# Patient Record
Sex: Female | Born: 1995 | Race: Black or African American | Hispanic: No | Marital: Single | Smoking: Never smoker
Health system: Southern US, Community
[De-identification: ages and names within clinical notes are randomized; demographics above are authoritative.]

## PROBLEM LIST (undated history)

## (undated) DIAGNOSIS — F419 Anxiety disorder, unspecified: Secondary | ICD-10-CM

## (undated) DIAGNOSIS — N760 Acute vaginitis: Secondary | ICD-10-CM

## (undated) DIAGNOSIS — B9689 Other specified bacterial agents as the cause of diseases classified elsewhere: Secondary | ICD-10-CM

## (undated) HISTORY — PX: NO PAST SURGERIES: SHX2092

## (undated) HISTORY — PX: WISDOM TOOTH EXTRACTION: SHX21

---
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
## 2015-12-06 ENCOUNTER — Emergency Department (HOSPITAL_COMMUNITY)
Admission: EM | Admit: 2015-12-06 | Discharge: 2015-12-06 | Disposition: A | Discharge status: Home / Self Care | Payer: Medicaid - Out of State | Attending: Emergency Medicine | Admitting: Emergency Medicine

## 2015-12-06 ENCOUNTER — Encounter (HOSPITAL_COMMUNITY): Payer: Self-pay | Admitting: Emergency Medicine

## 2015-12-06 DIAGNOSIS — R109 Unspecified abdominal pain: Secondary | ICD-10-CM | POA: Diagnosis present

## 2015-12-06 DIAGNOSIS — Z5321 Procedure and treatment not carried out due to patient leaving prior to being seen by health care provider: Secondary | ICD-10-CM | POA: Insufficient documentation

## 2015-12-06 LAB — CBC
HEMATOCRIT: 38.2 % (ref 36.0–46.0)
HEMOGLOBIN: 12.9 g/dL (ref 12.0–15.0)
MCH: 31.4 pg (ref 26.0–34.0)
MCHC: 33.8 g/dL (ref 30.0–36.0)
MCV: 92.9 fL (ref 78.0–100.0)
Platelets: 184 10*3/uL (ref 150–400)
RBC: 4.11 MIL/uL (ref 3.87–5.11)
RDW: 13.9 % (ref 11.5–15.5)
WBC: 6.4 10*3/uL (ref 4.0–10.5)

## 2015-12-06 LAB — URINALYSIS, ROUTINE W REFLEX MICROSCOPIC
Bilirubin Urine: NEGATIVE
GLUCOSE, UA: NEGATIVE mg/dL
Hgb urine dipstick: NEGATIVE
KETONES UR: NEGATIVE mg/dL
LEUKOCYTES UA: NEGATIVE
Nitrite: NEGATIVE
PH: 6 (ref 5.0–8.0)
Protein, ur: NEGATIVE mg/dL
SPECIFIC GRAVITY, URINE: 1.031 — AB (ref 1.005–1.030)

## 2015-12-06 LAB — COMPREHENSIVE METABOLIC PANEL
ALBUMIN: 3.8 g/dL (ref 3.5–5.0)
ALT: 18 U/L (ref 14–54)
ANION GAP: 5 (ref 5–15)
AST: 20 U/L (ref 15–41)
Alkaline Phosphatase: 51 U/L (ref 38–126)
BUN: 10 mg/dL (ref 6–20)
CHLORIDE: 109 mmol/L (ref 101–111)
CO2: 27 mmol/L (ref 22–32)
Calcium: 9.3 mg/dL (ref 8.9–10.3)
Creatinine, Ser: 0.77 mg/dL (ref 0.44–1.00)
GFR calc Af Amer: >60 mL/min (ref >60)
GFR calc non Af Amer: >60 mL/min (ref >60)
GLUCOSE: 91 mg/dL (ref 65–99)
POTASSIUM: 4.1 mmol/L (ref 3.5–5.1)
SODIUM: 141 mmol/L (ref 135–145)
Total Bilirubin: 0.4 mg/dL (ref 0.3–1.2)
Total Protein: 6.6 g/dL (ref 6.5–8.1)

## 2015-12-06 LAB — I-STAT BETA HCG BLOOD, ED (MC, WL, AP ONLY): I-stat hCG, quantitative: <5 m[IU]/mL (ref <5)

## 2015-12-06 LAB — LIPASE, BLOOD: LIPASE: 26 U/L (ref 11–51)

## 2015-12-06 NOTE — ED Notes (Signed)
Pt found in triage and upset that she wasn't found when her name was called the first time. Pt made aware this rn would move her to the next room. Pt states she just wants to leave and wants to just get a note work and not see a doctor. Pt informed she will need to see a doctor for a note. Pt aware she has a room ready and this RN will take her if she is willingly. Charge RN Italychad speaking with pt about seeing a MD if she would like a work note and pt refuses and states she has to go to work and no time to stay in ED any longer.

## 2015-12-06 NOTE — ED Notes (Signed)
Called Pt for room. No response. 

## 2015-12-06 NOTE — ED Triage Notes (Signed)
Pt states since last night she has had lower abd pain and a headache. denies any n/v/d.

## 2015-12-06 NOTE — ED Notes (Signed)
Pt called multiple times throughout several locations in the lobby. No answer.

## 2016-01-22 ENCOUNTER — Encounter (HOSPITAL_BASED_OUTPATIENT_CLINIC_OR_DEPARTMENT_OTHER): Payer: Self-pay | Admitting: Emergency Medicine

## 2016-01-22 ENCOUNTER — Emergency Department (HOSPITAL_BASED_OUTPATIENT_CLINIC_OR_DEPARTMENT_OTHER)
Admission: EM | Admit: 2016-01-22 | Discharge: 2016-01-22 | Disposition: A | Discharge status: Home / Self Care | Payer: No Typology Code available for payment source | Attending: Emergency Medicine | Admitting: Emergency Medicine

## 2016-01-22 ENCOUNTER — Emergency Department (HOSPITAL_BASED_OUTPATIENT_CLINIC_OR_DEPARTMENT_OTHER): Payer: No Typology Code available for payment source

## 2016-01-22 DIAGNOSIS — Y929 Unspecified place or not applicable: Secondary | ICD-10-CM | POA: Insufficient documentation

## 2016-01-22 DIAGNOSIS — M79671 Pain in right foot: Secondary | ICD-10-CM | POA: Insufficient documentation

## 2016-01-22 DIAGNOSIS — Y939 Activity, unspecified: Secondary | ICD-10-CM | POA: Insufficient documentation

## 2016-01-22 DIAGNOSIS — M79642 Pain in left hand: Secondary | ICD-10-CM | POA: Insufficient documentation

## 2016-01-22 DIAGNOSIS — Y999 Unspecified external cause status: Secondary | ICD-10-CM | POA: Insufficient documentation

## 2016-01-22 DIAGNOSIS — S0031XA Abrasion of nose, initial encounter: Secondary | ICD-10-CM | POA: Insufficient documentation

## 2016-01-22 DIAGNOSIS — M542 Cervicalgia: Secondary | ICD-10-CM | POA: Insufficient documentation

## 2016-01-22 DIAGNOSIS — T07XXXA Unspecified multiple injuries, initial encounter: Secondary | ICD-10-CM

## 2016-01-22 MED ORDER — IBUPROFEN 800 MG PO TABS: 800.0000 mg | ORAL_TABLET | Freq: Three times a day (TID) | ORAL | 0 refills | Status: DC

## 2016-01-22 NOTE — ED Notes (Signed)
ED Provider at bedside. 

## 2016-01-22 NOTE — ED Notes (Signed)
ED Provider at bedside to discuss results. 

## 2016-01-22 NOTE — ED Notes (Signed)
Given ice pack
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
## 2016-09-03 ENCOUNTER — Encounter (HOSPITAL_COMMUNITY): Payer: Self-pay | Admitting: *Deleted

## 2016-09-03 ENCOUNTER — Emergency Department (HOSPITAL_COMMUNITY)
Admission: EM | Admit: 2016-09-03 | Discharge: 2016-09-03 | Disposition: A | Discharge status: Home / Self Care | Payer: No Typology Code available for payment source | Attending: Emergency Medicine | Admitting: Emergency Medicine

## 2016-09-03 DIAGNOSIS — J029 Acute pharyngitis, unspecified: Secondary | ICD-10-CM | POA: Insufficient documentation

## 2016-09-03 DIAGNOSIS — B9689 Other specified bacterial agents as the cause of diseases classified elsewhere: Secondary | ICD-10-CM

## 2016-09-03 DIAGNOSIS — N760 Acute vaginitis: Secondary | ICD-10-CM | POA: Insufficient documentation

## 2016-09-03 HISTORY — DX: Other specified bacterial agents as the cause of diseases classified elsewhere: B96.89

## 2016-09-03 HISTORY — DX: Other specified bacterial agents as the cause of diseases classified elsewhere: N76.0

## 2016-09-03 LAB — URINALYSIS, ROUTINE W REFLEX MICROSCOPIC
Bilirubin Urine: NEGATIVE
GLUCOSE, UA: NEGATIVE mg/dL
Hgb urine dipstick: NEGATIVE
KETONES UR: NEGATIVE mg/dL
LEUKOCYTES UA: NEGATIVE
Nitrite: NEGATIVE
PH: 7 (ref 5.0–8.0)
PROTEIN: NEGATIVE mg/dL
Specific Gravity, Urine: 1.017 (ref 1.005–1.030)

## 2016-09-03 LAB — COMPREHENSIVE METABOLIC PANEL
ALBUMIN: 4 g/dL (ref 3.5–5.0)
ALK PHOS: 56 U/L (ref 38–126)
ALT: 16 U/L (ref 14–54)
ANION GAP: 4 — AB (ref 5–15)
AST: 22 U/L (ref 15–41)
BILIRUBIN TOTAL: 0.4 mg/dL (ref 0.3–1.2)
BUN: 7 mg/dL (ref 6–20)
CALCIUM: 9.5 mg/dL (ref 8.9–10.3)
CO2: 29 mmol/L (ref 22–32)
Chloride: 104 mmol/L (ref 101–111)
Creatinine, Ser: 0.89 mg/dL (ref 0.44–1.00)
GFR calc Af Amer: >60 mL/min (ref >60)
Glucose, Bld: 86 mg/dL (ref 65–99)
POTASSIUM: 3.8 mmol/L (ref 3.5–5.1)
Sodium: 137 mmol/L (ref 135–145)
TOTAL PROTEIN: 6.8 g/dL (ref 6.5–8.1)

## 2016-09-03 LAB — WET PREP, GENITAL
SPERM: NONE SEEN
TRICH WET PREP: NONE SEEN
Yeast Wet Prep HPF POC: NONE SEEN

## 2016-09-03 LAB — CBC
HEMATOCRIT: 39.4 % (ref 36.0–46.0)
HEMOGLOBIN: 13.5 g/dL (ref 12.0–15.0)
MCH: 32.1 pg (ref 26.0–34.0)
MCHC: 34.3 g/dL (ref 30.0–36.0)
MCV: 93.6 fL (ref 78.0–100.0)
Platelets: 182 10*3/uL (ref 150–400)
RBC: 4.21 MIL/uL (ref 3.87–5.11)
RDW: 13.3 % (ref 11.5–15.5)
WBC: 8.1 10*3/uL (ref 4.0–10.5)

## 2016-09-03 LAB — POC URINE PREG, ED: Preg Test, Ur: NEGATIVE

## 2016-09-03 LAB — RAPID STREP SCREEN (MED CTR MEBANE ONLY): STREPTOCOCCUS, GROUP A SCREEN (DIRECT): NEGATIVE

## 2016-09-03 MED ORDER — AZITHROMYCIN 250 MG PO TABS
1000.0000 mg | ORAL_TABLET | Freq: Once | ORAL | Status: AC
  Administered 2016-09-03: 1000 mg via ORAL
  Filled 2016-09-03: qty 4

## 2016-09-03 MED ORDER — METRONIDAZOLE 500 MG PO TABS: 500.0000 mg | ORAL_TABLET | Freq: Two times a day (BID) | ORAL | 0 refills | Status: DC

## 2016-09-03 MED ORDER — CEFTRIAXONE SODIUM 250 MG IJ SOLR
250.0000 mg | Freq: Once | INTRAMUSCULAR | Status: AC
  Administered 2016-09-03: 250 mg via INTRAMUSCULAR
  Filled 2016-09-03: qty 250

## 2016-09-03 NOTE — ED Provider Notes (Signed)
[facility]PT Provider Note   CSN: [id number] Arrival date & time: 09/03/16  1435     History   Chief Complaint Chief Complaint  Patient presents with  . Vaginal Discharge    HPI Nancy May is a 21 y.o. female.  HPI 21 year old female G0 LMP 2 weeks ago normal birth control pills today complaining of vaginal discharge and achy lower pelvic discort consistent with prior episodes of bacterial vaginosis. She denies any nausea, vomiting, fever, chills, abnormal vaginal bleeding, or urinary tract infection symptoms. She states she also has a sore throat began 2 days ago with nasal congestion and coughing. She not had fever with this. She took NyQuil for her symptoms yesterday. She is eating and speaking with difficulty. Past Medical History:  Diagnosis Date  . Bacterial vaginosis     There are no active problems to display for this patient.   History reviewed. No pertinent surgical history.  OB History    No data available       Home Medications    Prior to Admission medications   Medication Sig Start Date End Date Taking? Authorizing Provider  ibuprofen (ADVIL,MOTRIN) 800 MG tablet Take 1 tablet (800 mg total) by mouth 3 (three) times daily. 01/22/16   Fayrene Helper, PA-C    Family History No family history on file.  Social History Social History  Substance Use Topics  . Smoking status: Never Smoker  . Smokeless tobacco: Never Used  . Alcohol use No     Allergies   Patient has no known allergies.   Review of Systems Review of Systems  All other systems reviewed and are negative.    Physical Exam Updated Vital Signs BP 107/70 (BP Location: Right Arm)   Pulse 92   Temp 98.7 F (37.1 C) (Oral)   Resp 16   Ht 1.702 m (5\' 7" )   Wt 64.9 kg (143 lb)   SpO2 100%   BMI 22.40 kg/m   Physical Exam  Constitutional: She appears well-developed and well-nourished.  HENT:  Head: Normocephalic and atraumatic.  Right Ear: External ear normal.    Left Ear: External ear normal.  Nose: Nose normal.  Mouth/Throat: Oropharynx is clear and moist.  Eyes: Pupils are equal, round, and reactive to light. Conjunctivae and EOM are normal.  Neck: Normal range of motion. Neck supple.  Cardiovascular: Normal rate, regular rhythm and normal heart sounds.   Pulmonary/Chest: Effort normal and breath sounds normal.  Abdominal: Soft. Bowel sounds are normal.  Genitourinary: Vaginal discharge found.  Genitourinary Comments: Some whitish discharge. No cervical motion tenderness. No tenderness on bimanual exam of her uterus mild tenderness over left ovary no tenderness over right ovary  Musculoskeletal: Normal range of motion.  Neurological: She is alert.  Skin: Skin is warm and dry. Capillary refill takes less than 2 seconds.  Psychiatric: She has a normal mood and affect.  Nursing note and vitals reviewed.    ED Treatments / Results  Labs (all labs ordered are listed, but only abnormal results are displayed) Labs Reviewed  COMPREHENSIVE METABOLIC PANEL - Abnormal; Notable for the following:       Result Value   Anion gap 4 (*)    All other components within normal limits  RAPID STREP SCREEN (NOT AT [facility])  CULTURE, GROUP A STREP ([facility])  CBC  URINALYSIS, ROUTINE W REFLEX MICROSCOPIC  POC URINE PREG, ED    EKG  EKG Interpretation None       Radiology No results found.  Procedures Procedures (including critical care time)  Medications Ordered in ED Medications - No data to display   Initial Impression / Assessment and Plan / ED Course  I have reviewed the triage vital signs and the nursing notes.  Pertinent labs & imaging results that were available during my care of the patient were reviewed by me and considered in my medical decision making (see chart for details).    1- vaginal d/c- will treat for bv, rocephin and zithromax given in ed for cervicitis and possible pelvic infxns- gc and chlamydia ordered 2- sore throat-  patient with low probability for strep with centor score of 0.      Final Clinical Impressions(s) / ED Diagnoses   Final diagnoses:  BV (bacterial vaginosis)  Viral pharyngitis    New Prescriptions New Prescriptions   No medications on file     Margarita Grizzle, MD 09/03/16 2258

## 2016-09-03 NOTE — ED Triage Notes (Signed)
Pt is here for evaluation of BV.  She reports hx of same and some abdominal pain and vaginal discharge x over one week.  SHe would also like to be evaluated for URI and sore throat.

## 2016-09-04 LAB — GC/CHLAMYDIA PROBE AMP (~~LOC~~) NOT AT ARMC
CHLAMYDIA, DNA PROBE: NEGATIVE
NEISSERIA GONORRHEA: NEGATIVE

## 2016-09-06 LAB — CULTURE, GROUP A STREP (THRC)

## 2017-10-04 ENCOUNTER — Emergency Department (HOSPITAL_COMMUNITY)
Admission: EM | Admit: 2017-10-04 | Discharge: 2017-10-04 | Disposition: A | Discharge status: Home / Self Care | Payer: Self-pay | Attending: Emergency Medicine | Admitting: Emergency Medicine

## 2017-10-04 ENCOUNTER — Encounter (HOSPITAL_COMMUNITY): Payer: Self-pay | Admitting: Emergency Medicine

## 2017-10-04 ENCOUNTER — Emergency Department (HOSPITAL_COMMUNITY): Payer: Self-pay

## 2017-10-04 DIAGNOSIS — M545 Low back pain: Secondary | ICD-10-CM | POA: Insufficient documentation

## 2017-10-04 DIAGNOSIS — M7918 Myalgia, other site: Secondary | ICD-10-CM

## 2017-10-04 DIAGNOSIS — M25571 Pain in right ankle and joints of right foot: Secondary | ICD-10-CM | POA: Insufficient documentation

## 2017-10-04 MED ORDER — AMOXICILLIN-POT CLAVULANATE 875-125 MG PO TABS: 1.0000 | ORAL_TABLET | Freq: Two times a day (BID) | ORAL | 0 refills | Status: DC

## 2017-10-04 MED ORDER — IBUPROFEN 600 MG PO TABS: 600.0000 mg | ORAL_TABLET | Freq: Four times a day (QID) | ORAL | 0 refills | Status: DC | PRN

## 2017-10-04 MED ORDER — ACETAMINOPHEN 325 MG PO TABS
650.0000 mg | ORAL_TABLET | Freq: Once | ORAL | Status: AC
  Administered 2017-10-04: 650 mg via ORAL
  Filled 2017-10-04: qty 2

## 2017-10-04 NOTE — Discharge Instructions (Addendum)
Your xray did not shows signs of fracture or dislocation.  Please wear the ASO brace for support.  Please use crutches as needed to allow for rest.  Please follow rice therapy attached in handout.  You can take Tylenol or ibuprofen as needed for pain.  Do not take ibuprofen if you think you may be pregnant.  As we discussed there is no appreciable area that would require incision and drainage today.  This does not mean in the future you will not develop an area that requires incision and drainage.  I have attached a handout on pilonidal cyst.  I am prescribing an antibiotic at this time. Please take all of your antibiotics until finished!   You may develop abdominal discomfort or diarrhea from the antibiotic.  You may help offset this with probiotics which you can buy or get in yogurt. Do not eat or take the probiotics until 2 hours after your antibiotic. Do not take your medicine if develop an itchy rash, swelling in your mouth or lips, or difficulty breathing.   These follow-up with your primary care provider in 1 week if your symptoms do not improve. If you develop worsening or new concerning symptoms you can return to the emergency department for re-evaluation.

## 2017-10-04 NOTE — ED Notes (Signed)
Ortho tech called for ASO ankle and crutches. Discharge will be done after application of these items.
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
## 2017-10-04 NOTE — ED Notes (Signed)
Patient given discharge teaching and verbalized understanding. Patient ambulated out of ED on crutches.

## 2017-10-04 NOTE — ED Triage Notes (Signed)
Pt c/o cyst in buttock area for "while" her boyfriend told her that had little drainage out of it this morning.  Also having right ankle pain "all my life, but keeps popping and feels out of place" and right lateral knee pains "when bending".

## 2017-12-17 ENCOUNTER — Encounter (HOSPITAL_COMMUNITY): Payer: Self-pay | Admitting: *Deleted

## 2017-12-17 ENCOUNTER — Emergency Department (HOSPITAL_COMMUNITY)
Admission: EM | Admit: 2017-12-17 | Discharge: 2017-12-18 | Discharge status: Against Medical Advice | Payer: Self-pay | Attending: Emergency Medicine | Admitting: Emergency Medicine

## 2017-12-17 DIAGNOSIS — Z5321 Procedure and treatment not carried out due to patient leaving prior to being seen by health care provider: Secondary | ICD-10-CM | POA: Insufficient documentation

## 2017-12-17 NOTE — ED Triage Notes (Signed)
Pt reports ongoing bilateral knee pain but has been worse in left knee since yesterday.

## 2017-12-18 NOTE — ED Notes (Signed)
Did not respond to name in lobby.

## 2017-12-18 NOTE — ED Notes (Signed)
No answer x2 
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
## 2018-09-06 ENCOUNTER — Other Ambulatory Visit: Payer: Self-pay

## 2018-09-06 ENCOUNTER — Encounter (HOSPITAL_COMMUNITY): Payer: Self-pay

## 2018-09-06 ENCOUNTER — Emergency Department (HOSPITAL_COMMUNITY)
Admission: EM | Admit: 2018-09-06 | Discharge: 2018-09-06 | Disposition: A | Discharge status: Home / Self Care | Payer: Self-pay | Attending: Emergency Medicine | Admitting: Emergency Medicine

## 2018-09-06 DIAGNOSIS — Z5321 Procedure and treatment not carried out due to patient leaving prior to being seen by health care provider: Secondary | ICD-10-CM | POA: Insufficient documentation

## 2018-09-06 LAB — COMPREHENSIVE METABOLIC PANEL
ALT: 22 U/L (ref 0–44)
AST: 23 U/L (ref 15–41)
Albumin: 3.9 g/dL (ref 3.5–5.0)
Alkaline Phosphatase: 59 U/L (ref 38–126)
Anion gap: 7 (ref 5–15)
BUN: 11 mg/dL (ref 6–20)
CO2: 27 mmol/L (ref 22–32)
Calcium: 9.4 mg/dL (ref 8.9–10.3)
Chloride: 106 mmol/L (ref 98–111)
Creatinine, Ser: 0.83 mg/dL (ref 0.44–1.00)
GFR calc Af Amer: >60 mL/min (ref >60)
GFR calc non Af Amer: >60 mL/min (ref >60)
Glucose, Bld: 105 mg/dL — ABNORMAL HIGH (ref 70–99)
Potassium: 3.8 mmol/L (ref 3.5–5.1)
Sodium: 140 mmol/L (ref 135–145)
Total Bilirubin: 0.4 mg/dL (ref 0.3–1.2)
Total Protein: 7.4 g/dL (ref 6.5–8.1)

## 2018-09-06 LAB — CBC
HCT: 39.8 % (ref 36.0–46.0)
Hemoglobin: 13.3 g/dL (ref 12.0–15.0)
MCH: 31.9 pg (ref 26.0–34.0)
MCHC: 33.4 g/dL (ref 30.0–36.0)
MCV: 95.4 fL (ref 80.0–100.0)
Platelets: 185 10*3/uL (ref 150–400)
RBC: 4.17 MIL/uL (ref 3.87–5.11)
RDW: 12.8 % (ref 11.5–15.5)
WBC: 8.8 10*3/uL (ref 4.0–10.5)
nRBC: 0 % (ref 0.0–0.2)

## 2018-09-06 LAB — I-STAT BETA HCG BLOOD, ED (MC, WL, AP ONLY): I-stat hCG, quantitative: <5 m[IU]/mL (ref <5)

## 2018-09-06 LAB — LIPASE, BLOOD: Lipase: 26 U/L (ref 11–51)

## 2018-09-06 MED ORDER — SODIUM CHLORIDE 0.9% FLUSH: 3.0000 mL | Freq: Once | INTRAVENOUS | Status: DC

## 2018-09-06 NOTE — ED Triage Notes (Signed)
Patient c/o generalized abdominal pain and yesterday yesterday. Patient states the abdominal pain is worse today and diarrhea less today.

## 2018-11-02 ENCOUNTER — Emergency Department (HOSPITAL_COMMUNITY)
Admission: EM | Admit: 2018-11-02 | Discharge: 2018-11-02 | Disposition: A | Discharge status: Home / Self Care | Payer: Self-pay | Attending: Emergency Medicine | Admitting: Emergency Medicine

## 2018-11-02 ENCOUNTER — Encounter (HOSPITAL_COMMUNITY): Payer: Self-pay | Admitting: Emergency Medicine

## 2018-11-02 ENCOUNTER — Other Ambulatory Visit: Payer: Self-pay

## 2018-11-02 DIAGNOSIS — Z79899 Other long term (current) drug therapy: Secondary | ICD-10-CM | POA: Insufficient documentation

## 2018-11-02 DIAGNOSIS — L0501 Pilonidal cyst with abscess: Secondary | ICD-10-CM | POA: Insufficient documentation

## 2018-11-02 MED ORDER — IBUPROFEN 400 MG PO TABS
600.0000 mg | ORAL_TABLET | Freq: Once | ORAL | Status: AC
  Administered 2018-11-02: 600 mg via ORAL
  Filled 2018-11-02: qty 1

## 2018-11-02 MED ORDER — HYDROCODONE-ACETAMINOPHEN 5-325 MG PO TABS: 1.0000 | ORAL_TABLET | Freq: Four times a day (QID) | ORAL | 0 refills | Status: DC | PRN

## 2018-11-02 MED ORDER — LIDOCAINE-EPINEPHRINE (PF) 2 %-1:200000 IJ SOLN
10.0000 mL | Freq: Once | INTRAMUSCULAR | Status: AC
  Administered 2018-11-02: 10 mL
  Filled 2018-11-02: qty 20

## 2018-11-02 NOTE — ED Provider Notes (Signed)
[facility] EMERGENCY DEPARTMENT Provider Note   CSN: [id number] Arrival date & time: 11/02/18  1901     History   Chief Complaint Chief Complaint  Patient presents with  . Cyst    HPI Nancy May is a 23 y.o. female.     Patient to the ED with complaint of progressive, painful swelling to the left upper buttock over the last 2-3 days. No fever, nausea, drainage from the area. She reports a similar episode months ago that resolved spontaneously. She reports being afraid to have a bowel movement secondary to pain and feels constipated.   The history is provided by the patient. No language interpreter was used.    Past Medical History:  Diagnosis Date  . Bacterial vaginosis     There are no active problems to display for this patient.   No past surgical history on file.   OB History   No obstetric history on file.      Home Medications    Prior to Admission medications   Medication Sig Start Date End Date Taking? Authorizing Provider  amoxicillin-clavulanate (AUGMENTIN) 875-125 MG tablet Take 1 tablet by mouth every 12 (twelve) hours. 10/04/17   Maczis, Barth Kirks, PA-C  ibuprofen (ADVIL,MOTRIN) 600 MG tablet Take 1 tablet (600 mg total) by mouth every 6 (six) hours as needed. 10/04/17   Maczis, Barth Kirks, PA-C    Family History Family History  Problem Relation Age of Onset  . Heart failure Mother     Social History Social History   Tobacco Use  . Smoking status: Never Smoker  . Smokeless tobacco: Never Used  Substance Use Topics  . Alcohol use: No  . Drug use: No     Allergies   Patient has no known allergies.   Review of Systems Review of Systems  Constitutional: Negative for chills and fever.  HENT: Negative.   Gastrointestinal: Positive for constipation. Negative for abdominal pain and nausea.  Musculoskeletal:       See HPI.  Skin: Positive for wound.  Neurological: Negative.      Physical Exam Updated Vital  Signs BP (!) 138/101   Pulse (!) 103   Temp 98.4 F (36.9 C) (Oral)   Resp 20   Ht 5\' 7"  (1.702 m)   Wt 70.3 kg   SpO2 98%   BMI 24.27 kg/m   Physical Exam Constitutional:      Appearance: She is well-developed.  Neck:     Musculoskeletal: Normal range of motion.  Pulmonary:     Effort: Pulmonary effort is normal.  Musculoskeletal: Normal range of motion.  Skin:    General: Skin is warm and dry.     Comments: There is an indurated area measuring approximately 3 cm x 3 cm along the left pilonidal buttock. No perirectal induration or tenderness.   Neurological:     Mental Status: She is alert and oriented to person, place, and time.      ED Treatments / Results  Labs (all labs ordered are listed, but only abnormal results are displayed) Labs Reviewed - No data to display  EKG None  Radiology No results found.  Procedures .Marland KitchenIncision and Drainage  Date/Time: 11/02/2018 10:54 PM Performed by: Charlann Lange, PA-C Authorized by: Charlann Lange, PA-C   Consent:    Consent obtained:  Verbal   Consent given by:  Patient Location:    Type:  Pilonidal cyst   Size:  3 x 3 cm   Location:  Anogenital  Anogenital location:  Pilonidal Pre-procedure details:    Skin preparation:  Betadine Anesthesia (see MAR for exact dosages):    Anesthesia method:  Local infiltration   Local anesthetic:  Lidocaine 2% WITH epi Procedure type:    Complexity:  Simple Procedure details:    Needle aspiration: no     Incision types:  Single straight   Scalpel blade:  11   Wound management:  Probed and deloculated and irrigated with saline   Drainage:  Bloody and purulent   Drainage amount:  Moderate   Packing materials:  1/4 in iodoform gauze Post-procedure details:    Patient tolerance of procedure:  Tolerated well, no immediate complications   (including critical care time)  Medications Ordered in ED Medications  lidocaine-EPINEPHrine (XYLOCAINE W/EPI) 2 %-1:200000 (PF)  injection 10 mL (has no administration in time range)  ibuprofen (ADVIL) tablet 600 mg (600 mg Oral Given 11/02/18 1919)     Initial Impression / Assessment and Plan / ED Course  I have reviewed the triage vital signs and the nursing notes.  Pertinent labs & imaging results that were available during my care of the patient were reviewed by me and considered in my medical decision making (see chart for details).        Patient to ED with left pilonidal abscess requiring I&D as per above procedure note.   After care instructions discussed, including 2 day recheck for packing removal.   Final Clinical Impressions(s) / ED Diagnoses   Final diagnoses:  None   1. Pilonidal abscess  ED Discharge Orders    None       Elpidio Anis, Cordelia Poche 11/02/18 2257    Tegeler, Canary Brim, MD 11/02/18 [id number]

## 2018-11-02 NOTE — Discharge Instructions (Addendum)
Return in 2 days for packing removal and recheck. Take ibuprofen 600 mg (3 Advil or Motril, or generic ibuprofen) every 6 hours for pain and Norco if needed for severe pain.   Return sooner with any fever, severe pain or new concern.

## 2018-11-02 NOTE — ED Triage Notes (Signed)
Patient says she has a "cyst" close to her rectum. She report being in a lot of pain. She states she had this about 7 months ago but not this painful.

## 2018-11-02 NOTE — ED Notes (Signed)
Patient verbalizes understanding of discharge instructions. Opportunity for questioning and answers were provided. Armband removed by staff, pt discharged from ED.  

## 2018-11-05 ENCOUNTER — Other Ambulatory Visit: Payer: Self-pay

## 2018-11-05 ENCOUNTER — Emergency Department (HOSPITAL_COMMUNITY)
Admission: EM | Admit: 2018-11-05 | Discharge: 2018-11-05 | Disposition: A | Discharge status: Home / Self Care | Payer: Self-pay | Attending: Emergency Medicine | Admitting: Emergency Medicine

## 2018-11-05 DIAGNOSIS — Z5189 Encounter for other specified aftercare: Secondary | ICD-10-CM

## 2018-11-05 DIAGNOSIS — L0501 Pilonidal cyst with abscess: Secondary | ICD-10-CM | POA: Insufficient documentation

## 2018-11-05 DIAGNOSIS — Z79899 Other long term (current) drug therapy: Secondary | ICD-10-CM | POA: Insufficient documentation

## 2018-11-05 DIAGNOSIS — Z4800 Encounter for change or removal of nonsurgical wound dressing: Secondary | ICD-10-CM | POA: Insufficient documentation

## 2018-11-05 NOTE — ED Provider Notes (Signed)
[facility] EMERGENCY DEPARTMENT Provider Note   CSN: [id number] Arrival date & time: 11/05/18  1536     History   Chief Complaint No chief complaint on file.   HPI Nancy May is a 23 y.o. female.     HPI   23 year old female presents today for packing removal.  Patient was seen on 11/02/2018 with pilonidal abscess.  She had I&D and packing placed.  She notes some ongoing pain at the area no worsening symptoms no fever.  Past Medical History:  Diagnosis Date  . Bacterial vaginosis     There are no active problems to display for this patient.   No past surgical history on file.   OB History   No obstetric history on file.      Home Medications    Prior to Admission medications   Medication Sig Start Date End Date Taking? Authorizing Provider  amoxicillin-clavulanate (AUGMENTIN) 875-125 MG tablet Take 1 tablet by mouth every 12 (twelve) hours. 10/04/17   Maczis, Barth Kirks, PA-C  HYDROcodone-acetaminophen (NORCO/VICODIN) 5-325 MG tablet Take 1 tablet by mouth every 6 (six) hours as needed for severe pain. 11/02/18   Charlann Lange, PA-C  ibuprofen (ADVIL,MOTRIN) 600 MG tablet Take 1 tablet (600 mg total) by mouth every 6 (six) hours as needed. 10/04/17   Maczis, Barth Kirks, PA-C    Family History Family History  Problem Relation Age of Onset  . Heart failure Mother     Social History Social History   Tobacco Use  . Smoking status: Never Smoker  . Smokeless tobacco: Never Used  Substance Use Topics  . Alcohol use: No  . Drug use: No     Allergies   Patient has no known allergies.   Review of Systems Review of Systems  All other systems reviewed and are negative.    Physical Exam Updated Vital Signs BP 114/74 (BP Location: Left Arm)   Pulse 66   Temp 98.5 F (36.9 C) (Oral)   Resp 16   SpO2 100%   Physical Exam Vitals signs and nursing note reviewed.  Constitutional:      Appearance: She is well-developed.   HENT:     Head: Normocephalic and atraumatic.  Eyes:     General: No scleral icterus.       Right eye: No discharge.        Left eye: No discharge.     Conjunctiva/sclera: Conjunctivae normal.     Pupils: Pupils are equal, round, and reactive to light.  Neck:     Musculoskeletal: Normal range of motion.     Vascular: No JVD.     Trachea: No tracheal deviation.  Pulmonary:     Effort: Pulmonary effort is normal.     Breath sounds: No stridor.  Skin:    Comments: Packing in place to pilonidal region no surrounding erythema or significant induration  Neurological:     Mental Status: She is alert and oriented to person, place, and time.     Coordination: Coordination normal.  Psychiatric:        Behavior: Behavior normal.        Thought Content: Thought content normal.        Judgment: Judgment normal.      ED Treatments / Results  Labs (all labs ordered are listed, but only abnormal results are displayed) Labs Reviewed - No data to display  EKG None  Radiology No results found.  Procedures Procedures (including critical care time)  Medications  Ordered in ED Medications - No data to display   Initial Impression / Assessment and Plan / ED Course  I have reviewed the triage vital signs and the nursing notes.  Pertinent labs & imaging results that were available during my care of the patient were reviewed by me and considered in my medical decision making (see chart for details).        23 year old female here for wound check.  Packing was removed wound healing appropriately no signs of active infection discharged with symptomatic care return precautions.  Verbalized understanding and agreement to today's plan.  Final Clinical Impressions(s) / ED Diagnoses   Final diagnoses:  Wound check, abscess    ED Discharge Orders    None       Rosalio Loud 11/05/18 1553    Raeford Razor, MD 11/05/18 1649

## 2018-11-05 NOTE — ED Triage Notes (Signed)
Pt here to have packing removed from a cyst she had drained on 10/17. Reports some pain and drainage from the site.

## 2018-11-05 NOTE — Discharge Instructions (Addendum)
Please read attached information. If you experience any new or worsening signs or symptoms please return to the emergency room for evaluation. Please follow-up with your primary care provider or specialist as discussed.  °

## 2018-11-16 ENCOUNTER — Encounter (HOSPITAL_COMMUNITY): Payer: Self-pay | Admitting: Emergency Medicine

## 2018-11-16 ENCOUNTER — Emergency Department (HOSPITAL_COMMUNITY)
Admission: EM | Admit: 2018-11-16 | Discharge: 2018-11-16 | Disposition: A | Discharge status: Home / Self Care | Payer: Self-pay | Attending: Emergency Medicine | Admitting: Emergency Medicine

## 2018-11-16 ENCOUNTER — Other Ambulatory Visit: Payer: Self-pay

## 2018-11-16 DIAGNOSIS — L0501 Pilonidal cyst with abscess: Secondary | ICD-10-CM | POA: Insufficient documentation

## 2018-11-16 MED ORDER — DOXYCYCLINE HYCLATE 100 MG PO CAPS: 100.0000 mg | ORAL_CAPSULE | Freq: Two times a day (BID) | ORAL | 0 refills | Status: DC

## 2018-11-16 MED ORDER — IBUPROFEN 400 MG PO TABS
600.0000 mg | ORAL_TABLET | Freq: Once | ORAL | Status: AC
  Administered 2018-11-16: 600 mg via ORAL
  Filled 2018-11-16: qty 1

## 2018-11-16 MED ORDER — DOXYCYCLINE HYCLATE 100 MG PO TABS
100.0000 mg | ORAL_TABLET | Freq: Once | ORAL | Status: AC
  Administered 2018-11-16: 100 mg via ORAL
  Filled 2018-11-16: qty 1

## 2018-11-16 MED ORDER — IBUPROFEN 600 MG PO TABS: 600.0000 mg | ORAL_TABLET | Freq: Four times a day (QID) | ORAL | 0 refills | Status: DC | PRN

## 2018-11-16 NOTE — Discharge Instructions (Signed)
Please take antibiotics as directed, use ibuprofen 600 mg every 6 hours for pain, Tylenol as needed for additional pain.  There is not an area large enough for drainage at this time but antibiotics and warm soaks and compresses may help bring this to ahead.  If you have increasing pain and swelling in this area please return to the ED for reevaluation, or if you develop any fevers or other new or concerning symptoms.  For recurrent pilonidal cyst you can follow-up with general surgery.  You can establish primary care with the [facility].

## 2018-11-16 NOTE — ED Provider Notes (Signed)
[facility] EMERGENCY DEPARTMENT Provider Note   CSN: [id number] Arrival date & time: 11/16/18  0906     History   Chief Complaint Chief Complaint  Patient presents with  . Abscess    HPI Nancy May is a 23 y.o. female.     Nancy May is a 23 y.o. female with a history of recurrent pilonidal abscesses and BV, who presents to the ED for pain over the gluteal cleft.  Pain started yesterday.  Patient reports she was seen in the ED on 10/17 for similar pain and had an incision and drainage of a pilonidal abscess, she has had these multiple times in the past but this was the first 1 that required drainage.  She reports that this is improved and seemed to resolve but 1 day ago she started to feel pain and swelling in this area again and is concerned it is returning.  She reports pain is severe and is worse when she is sitting, making it difficult for her to drive to get to work.  She has not looked at the area, and has not noted any drainage.  No fevers or chills.  No vomiting or diarrhea.  After last drainage she was not placed on antibiotics.  She does not have a PCP here in the area to follow-up with, and is working on getting established with Medicaid.  No other aggravating or alleviating factors.     Past Medical History:  Diagnosis Date  . Bacterial vaginosis     There are no active problems to display for this patient.   History reviewed. No pertinent surgical history.   OB History   No obstetric history on file.      Home Medications    Prior to Admission medications   Medication Sig Start Date End Date Taking? Authorizing Provider  amoxicillin-clavulanate (AUGMENTIN) 875-125 MG tablet Take 1 tablet by mouth every 12 (twelve) hours. 10/04/17   Maczis, Elmer Sow, PA-C  doxycycline (VIBRAMYCIN) 100 MG capsule Take 1 capsule (100 mg total) by mouth 2 (two) times daily. One po bid x 7 days 11/16/18   Dartha Lodge, PA-C   HYDROcodone-acetaminophen (NORCO/VICODIN) 5-325 MG tablet Take 1 tablet by mouth every 6 (six) hours as needed for severe pain. 11/02/18   Elpidio Anis, PA-C  ibuprofen (ADVIL) 600 MG tablet Take 1 tablet (600 mg total) by mouth every 6 (six) hours as needed. 11/16/18   Dartha Lodge, PA-C    Family History Family History  Problem Relation Age of Onset  . Heart failure Mother     Social History Social History   Tobacco Use  . Smoking status: Never Smoker  . Smokeless tobacco: Never Used  Substance Use Topics  . Alcohol use: No  . Drug use: No     Allergies   Patient has no known allergies.   Review of Systems Review of Systems  Constitutional: Negative for chills and fever.  Gastrointestinal: Negative for abdominal pain, nausea, rectal pain and vomiting.  Skin: Positive for color change. Negative for wound.       Abscess     Physical Exam Updated Vital Signs BP 117/72 (BP Location: Right Arm)   Pulse 69   Temp 98.5 F (36.9 C) (Oral)   Resp 16   LMP 11/15/2018   SpO2 98%   Physical Exam Vitals signs and nursing note reviewed.  Constitutional:      General: She is not in acute distress.    Appearance:  Normal appearance. She is well-developed and normal weight. She is not ill-appearing or diaphoretic.  HENT:     Head: Normocephalic and atraumatic.  Eyes:     General:        Right eye: No discharge.        Left eye: No discharge.  Pulmonary:     Effort: Pulmonary effort is normal. No respiratory distress.  Genitourinary:    Comments: Site of previous I & D noted and well healed, there is some slight tenderness and induration over this area, but no palpable fluctuance of evident drainable abscess, no overlying erythema Skin:    General: Skin is warm and dry.  Neurological:     Mental Status: She is alert and oriented to person, place, and time.     Coordination: Coordination normal.  Psychiatric:        Mood and Affect: Mood normal.        Behavior:  Behavior normal.      ED Treatments / Results  Labs (all labs ordered are listed, but only abnormal results are displayed) Labs Reviewed - No data to display  EKG None  Radiology No results found.  Procedures Procedures (including critical care time)  Medications Ordered in ED Medications  doxycycline (VIBRA-TABS) tablet 100 mg (100 mg Oral Given 11/16/18 1047)  ibuprofen (ADVIL) tablet 600 mg (600 mg Oral Given 11/16/18 1047)     Initial Impression / Assessment and Plan / ED Course  I have reviewed the triage vital signs and the nursing notes.  Pertinent labs & imaging results that were available during my care of the patient were reviewed by me and considered in my medical decision making (see chart for details).  23 year old female presents with concern for recurrent pilonidal abscess, had incision and drainage on 10/17 with improvement, on exam patient has some tenderness and slight induration in this area but no noted drainable abscess or fluid collection, patient will be placed on antibiotics to hopefully help bring this to ahead I have encouraged her to do warm compresses as well.  [facility] for PCP follow-up.  Given that these are recurrent issue for the patient I have referred her to general surgery if these continue to occur.  Return precautions discussed.  Patient expresses understanding and agreement.  Discharged home in good condition.  Final Clinical Impressions(s) / ED Diagnoses   Final diagnoses:  Pilonidal abscess    ED Discharge Orders         Ordered    doxycycline (VIBRAMYCIN) 100 MG capsule  2 times daily     11/16/18 1056    ibuprofen (ADVIL) 600 MG tablet  Every 6 hours PRN     11/16/18 [address] [place], [place] 11/17/18 0725    Pattricia Boss, MD 11/18/18 [id number]

## 2018-11-16 NOTE — ED Triage Notes (Signed)
Pt states she was seen in ED for abscess to L buttocks on 10/17 and had I&D.  States it was initially getting better but has now gotten worse.

## 2018-11-27 NOTE — Progress Notes (Signed)
Patient ID: Nancy May, female   DOB: 02/17/1995, 23 y.o.   MRN: [id number]  Virtual Visit via Telephone Note  I connected with Nancy May on 11/28/18 at  9:30 AM EST by telephone and verified that I am speaking with the correct person using two identifiers.   I discussed the limitations, risks, security and privacy concerns of performing an evaluation and management service by telephone and the availability of in person appointments. I also discussed with the patient that there may be a patient responsible charge related to this service. The patient expressed understanding and agreed to proceed.  Patient location:  Home My Location:  [place] office Persons on the call:  Me and the the patient  History of Present Illness: After several ED visits for pilonidal sinus.  Most recently 11/16/2018.  She has finished the antibiotics and now thinks she might be getting a yeast infection.  No fever.  Wants to see a surgeon ASAP bc this has recurred several times.    From ED note: Nancy May is a 23 y.o. female with a history of recurrent pilonidal abscesses and BV, who presents to the ED for pain over the gluteal cleft.  Pain started yesterday.  Patient reports she was seen in the ED on 10/17 for similar pain and had an incision and drainage of a pilonidal abscess, she has had these multiple times in the past but this was the first 1 that required drainage.  She reports that this is improved and seemed to resolve but 1 day ago she started to feel pain and swelling in this area again and is concerned it is returning.  She reports pain is severe and is worse when she is sitting, making it difficult for her to drive to get to work.  She has not looked at the area, and has not noted any drainage.  No fevers or chills.  No vomiting or diarrhea.  After last drainage she was not placed on antibiotics.  She does not have a PCP here in the area to follow-up with, and is working on getting established  with Medicaid.  No other aggravating or alleviating factors.  From A/P: 23 year old female presents with concern for recurrent pilonidal abscess, had incision and drainage on 10/17 with improvement, on exam patient has some tenderness and slight induration in this area but no noted drainable abscess or fluid collection, patient will be placed on antibiotics to hopefully help bring this to ahead I have encouraged her to do warm compresses as well.  [facility] for PCP follow-up.  Given that these are recurrent issue for the patient I have referred her to general surgery if these continue to occur.  Return precautions discussed.  Patient expresses understanding and agreement.  Discharged home in good condition.   Observations/Objective:  NAD.  A&Ox3   Assessment and Plan: 1. Yeast infection s/p antibiotics - fluconazole (DIFLUCAN) 150 MG tablet; Take 1 tablet (150 mg total) by mouth once for 1 dose.  Dispense: 1 tablet; Refill: 0  2. Pilonidal sinus - Ambulatory referral to General Surgery  3. Encounter for examination following treatment at hospital improving   Follow Up Instructions: Assign PCP-Dr [facility] 12/17 scheduled   I discussed the assessment and treatment plan with the patient. The patient was provided an opportunity to ask questions and all were answered. The patient agreed with the plan and demonstrated an understanding of the instructions.   The patient was advised to call back or seek an  in-person evaluation if the symptoms worsen or if the condition fails to improve as anticipated.  I provided 18 minutes of non-face-to-face time during this encounter.   Georgian Co, PA-C

## 2018-11-28 ENCOUNTER — Other Ambulatory Visit: Payer: Self-pay

## 2018-11-28 ENCOUNTER — Ambulatory Visit: Payer: Self-pay | Attending: Family Medicine | Admitting: Physician Assistant

## 2018-11-28 DIAGNOSIS — B379 Candidiasis, unspecified: Secondary | ICD-10-CM

## 2018-11-28 DIAGNOSIS — Z09 Encounter for follow-up examination after completed treatment for conditions other than malignant neoplasm: Secondary | ICD-10-CM

## 2018-11-28 DIAGNOSIS — L0592 Pilonidal sinus without abscess: Secondary | ICD-10-CM

## 2018-11-28 DIAGNOSIS — B373 Candidiasis of vulva and vagina: Secondary | ICD-10-CM

## 2018-11-28 HISTORY — DX: Pilonidal sinus without abscess: L05.92

## 2018-11-28 MED ORDER — FLUCONAZOLE 150 MG PO TABS: 150.0000 mg | ORAL_TABLET | Freq: Once | ORAL | 0 refills | Status: AC

## 2019-01-02 ENCOUNTER — Ambulatory Visit: Payer: Self-pay | Admitting: Family Medicine

## 2019-01-24 ENCOUNTER — Ambulatory Visit: Payer: Self-pay | Admitting: Family Medicine

## 2019-02-10 ENCOUNTER — Encounter: Payer: Self-pay | Admitting: Family Medicine

## 2019-02-10 ENCOUNTER — Ambulatory Visit: Payer: Self-pay | Attending: Family Medicine | Admitting: Family Medicine

## 2019-02-10 ENCOUNTER — Other Ambulatory Visit: Payer: Self-pay

## 2019-02-10 VITALS — BP 115/74 | HR 71 | Ht 67.0 in | Wt 168.0 lb

## 2019-02-10 DIAGNOSIS — L0592 Pilonidal sinus without abscess: Secondary | ICD-10-CM

## 2019-02-10 DIAGNOSIS — Z131 Encounter for screening for diabetes mellitus: Secondary | ICD-10-CM

## 2019-02-10 DIAGNOSIS — G44209 Tension-type headache, unspecified, not intractable: Secondary | ICD-10-CM

## 2019-02-10 LAB — POCT GLYCOSYLATED HEMOGLOBIN (HGB A1C): HbA1c, POC (prediabetic range): 5.3 % — AB (ref 5.7–6.4)

## 2019-02-10 MED ORDER — FLUTICASONE PROPIONATE 50 MCG/ACT NA SUSP: 2.0000 | Freq: Every day | NASAL | 1 refills | Status: DC

## 2019-02-10 MED ORDER — BUTALBITAL-APAP-CAFFEINE 50-325-40 MG PO TABS: 1.0000 | ORAL_TABLET | Freq: Four times a day (QID) | ORAL | 0 refills | Status: DC | PRN

## 2019-02-10 NOTE — Progress Notes (Signed)
Has been having migraines. Missed general surgery appointment.

## 2019-02-10 NOTE — Progress Notes (Signed)
Subjective:  Patient ID: Nancy May, female    DOB: July 28, 1995  Age: 24 y.o. MRN: [id number]  CC: Migraine   HPI Nancy May presents to establish care.  She had a pilonidal sinus abscess which was treated 3 months ago with antibiotics and that visit 2 months ago she received Diflucan for yeast infection and was referred to general surgery. She never got a call for an appointment with General Surgery but notes in the chart indicates she was a no-show.  She no longer has it any drainage from the pilonidal sinus.  She has had headaches daily for several years; endorses coughing up phlegm. Going to sleep makes headache go away. No medication relieves the headache which is generalized and unrelated to activity.  She sometimes has photophobia but no phonophobia and denies nausea, vomiting or blurry vision with her headaches. Would like to be checked for Diabetes due to positive family history.  Past Medical History:  Diagnosis Date  . Bacterial vaginosis     No past surgical history on file.  Family History  Problem Relation Age of Onset  . Heart failure Mother     No Known Allergies  Outpatient Medications Prior to Visit  Medication Sig Dispense Refill  . ibuprofen (ADVIL) 600 MG tablet Take 1 tablet (600 mg total) by mouth every 6 (six) hours as needed. (Patient not taking: Reported on 11/28/2018) 30 tablet 0   No facility-administered medications prior to visit.     ROS Review of Systems  Constitutional: Negative for activity change, appetite change and fatigue.  HENT: Negative for congestion, sinus pressure and sore throat.   Eyes: Negative for visual disturbance.  Respiratory: Negative for cough, chest tightness, shortness of breath and wheezing.   Cardiovascular: Negative for chest pain and palpitations.  Gastrointestinal: Negative for abdominal distention, abdominal pain and constipation.  Endocrine: Negative for polydipsia.  Genitourinary: Negative for  dysuria and frequency.  Musculoskeletal: Negative for arthralgias and back pain.  Skin: Negative for rash.  Neurological: Positive for headaches. Negative for tremors, light-headedness and numbness.  Hematological: Does not bruise/bleed easily.  Psychiatric/Behavioral: Negative for agitation and behavioral problems.    Objective:  BP 115/74   Pulse 71   Ht 5\' 7"  (1.702 m)   Wt 168 lb (76.2 kg)   SpO2 97%   BMI 26.31 kg/m   BP/Weight 02/10/2019 11/16/2018 11/05/2018  Systolic BP 115 117 114  Diastolic BP 74 72 74  Wt. (Lbs) 168 - -  BMI 26.31 - -      Physical Exam Constitutional:      Appearance: She is well-developed.  Neck:     Vascular: No JVD.  Cardiovascular:     Rate and Rhythm: Normal rate.     Heart sounds: Normal heart sounds. No murmur.  Pulmonary:     Effort: Pulmonary effort is normal.     Breath sounds: Normal breath sounds. No wheezing or rales.  Chest:     Chest wall: No tenderness.  Abdominal:     General: Bowel sounds are normal. There is no distension.     Palpations: Abdomen is soft. There is no mass.     Tenderness: There is no abdominal tenderness.  Genitourinary:    Comments: Pilonidal sinus in upper part of gluteal cleft which is healed with no evidence of abscess Musculoskeletal:        General: Normal range of motion.     Right lower leg: No edema.     Left lower leg:  No edema.  Skin:    Comments: Pilonidal sinus  - no evidence of infection, no TTP  Neurological:     Mental Status: She is alert and oriented to person, place, and time.  Psychiatric:        Mood and Affect: Mood normal.     CMP Latest Ref Rng & Units 09/06/2018 09/03/2016 12/06/2015  Glucose 70 - 99 mg/dL 105(H) 86 91  BUN 6 - 20 mg/dL 11 7 10   Creatinine 0.44 - 1.00 mg/dL 0.83 0.89 0.77  Sodium 135 - 145 mmol/L 140 137 141  Potassium 3.5 - 5.1 mmol/L 3.8 3.8 4.1  Chloride 98 - 111 mmol/L 106 104 109  CO2 22 - 32 mmol/L 27 29 27   Calcium 8.9 - 10.3 mg/dL 9.4 9.5  9.3  Total Protein 6.5 - 8.1 g/dL 7.4 6.8 6.6  Total Bilirubin 0.3 - 1.2 mg/dL 0.4 0.4 0.4  Alkaline Phos 38 - 126 U/L 59 56 51  AST 15 - 41 U/L 23 22 20   ALT 0 - 44 U/L 22 16 18     Lipid Panel  No results found for: CHOL, TRIG, HDL, CHOLHDL, VLDL, LDLCALC, LDLDIRECT  CBC    Component Value Date/Time   WBC 8.8 09/06/2018 1551   RBC 4.17 09/06/2018 1551   HGB 13.3 09/06/2018 1551   HCT 39.8 09/06/2018 1551   PLT 185 09/06/2018 1551   MCV 95.4 09/06/2018 1551   MCH 31.9 09/06/2018 1551   MCHC 33.4 09/06/2018 1551   RDW 12.8 09/06/2018 1551   Lab Results  Component Value Date   HGBA1C 5.3 (A) 02/10/2019     Assessment & Plan:  1. Tension headache We will treat as tension headache and also add Flonase in the event that there is a sinus component Low suspicion for migraines - butalbital-acetaminophen-caffeine (FIORICET) 50-325-40 MG tablet; Take 1 tablet by mouth every 6 (six) hours as needed for headache.  Dispense: 30 tablet; Refill: 0 - fluticasone (FLONASE) 50 MCG/ACT nasal spray; Place 2 sprays into both nostrils daily.  Dispense: 16 g; Refill: 1  2. Pilonidal sinus Healed Provided number for [facility] surgery in the event that she has a recurrence  3. Screening for diabetes mellitus Normal with A1c of 5.3 - POCT glycosylated hemoglobin (Hb A1C)       Charlott Rakes, MD, FAAFP. [facility] and [place], [place]   02/10/2019, 3:01 PM

## 2019-02-11 ENCOUNTER — Encounter: Payer: Self-pay | Admitting: Family Medicine

## 2019-02-11 ENCOUNTER — Ambulatory Visit: Payer: Self-pay | Admitting: Family Medicine

## 2019-03-25 ENCOUNTER — Ambulatory Visit: Payer: Self-pay | Admitting: Family Medicine

## 2019-04-29 ENCOUNTER — Encounter (HOSPITAL_COMMUNITY): Payer: Self-pay | Admitting: Emergency Medicine

## 2019-04-29 ENCOUNTER — Emergency Department (HOSPITAL_COMMUNITY)
Admission: EM | Admit: 2019-04-29 | Discharge: 2019-04-29 | Disposition: A | Discharge status: Home / Self Care | Payer: Self-pay | Attending: Emergency Medicine | Admitting: Emergency Medicine

## 2019-04-29 ENCOUNTER — Emergency Department (HOSPITAL_COMMUNITY): Payer: Self-pay

## 2019-04-29 DIAGNOSIS — R102 Pelvic and perineal pain: Secondary | ICD-10-CM

## 2019-04-29 DIAGNOSIS — N730 Acute parametritis and pelvic cellulitis: Secondary | ICD-10-CM

## 2019-04-29 DIAGNOSIS — R350 Frequency of micturition: Secondary | ICD-10-CM | POA: Insufficient documentation

## 2019-04-29 DIAGNOSIS — N739 Female pelvic inflammatory disease, unspecified: Secondary | ICD-10-CM | POA: Insufficient documentation

## 2019-04-29 LAB — URINALYSIS, ROUTINE W REFLEX MICROSCOPIC
Bacteria, UA: NONE SEEN
Bilirubin Urine: NEGATIVE
Glucose, UA: NEGATIVE mg/dL
Ketones, ur: NEGATIVE mg/dL
Nitrite: NEGATIVE
Protein, ur: NEGATIVE mg/dL
Specific Gravity, Urine: 1.019 (ref 1.005–1.030)
WBC, UA: >50 WBC/hpf — ABNORMAL HIGH (ref 0–5)
pH: 7 (ref 5.0–8.0)

## 2019-04-29 LAB — COMPREHENSIVE METABOLIC PANEL
ALT: 14 U/L (ref 0–44)
AST: 18 U/L (ref 15–41)
Albumin: 3.4 g/dL — ABNORMAL LOW (ref 3.5–5.0)
Alkaline Phosphatase: 64 U/L (ref 38–126)
Anion gap: 8 (ref 5–15)
BUN: 9 mg/dL (ref 6–20)
CO2: 23 mmol/L (ref 22–32)
Calcium: 8.9 mg/dL (ref 8.9–10.3)
Chloride: 108 mmol/L (ref 98–111)
Creatinine, Ser: 0.86 mg/dL (ref 0.44–1.00)
GFR calc Af Amer: >60 mL/min (ref >60)
GFR calc non Af Amer: >60 mL/min (ref >60)
Glucose, Bld: 97 mg/dL (ref 70–99)
Potassium: 4.3 mmol/L (ref 3.5–5.1)
Sodium: 139 mmol/L (ref 135–145)
Total Bilirubin: 0.8 mg/dL (ref 0.3–1.2)
Total Protein: 6.2 g/dL — ABNORMAL LOW (ref 6.5–8.1)

## 2019-04-29 LAB — CBC
HCT: 38.5 % (ref 36.0–46.0)
Hemoglobin: 12.8 g/dL (ref 12.0–15.0)
MCH: 32.2 pg (ref 26.0–34.0)
MCHC: 33.2 g/dL (ref 30.0–36.0)
MCV: 97 fL (ref 80.0–100.0)
Platelets: 217 10*3/uL (ref 150–400)
RBC: 3.97 MIL/uL (ref 3.87–5.11)
RDW: 13.4 % (ref 11.5–15.5)
WBC: 9.6 10*3/uL (ref 4.0–10.5)
nRBC: 0 % (ref 0.0–0.2)

## 2019-04-29 LAB — WET PREP, GENITAL
Clue Cells Wet Prep HPF POC: NONE SEEN
Sperm: NONE SEEN
Trich, Wet Prep: NONE SEEN
Yeast Wet Prep HPF POC: NONE SEEN

## 2019-04-29 LAB — I-STAT BETA HCG BLOOD, ED (MC, WL, AP ONLY): I-stat hCG, quantitative: <5 m[IU]/mL (ref <5)

## 2019-04-29 LAB — LIPASE, BLOOD: Lipase: 22 U/L (ref 11–51)

## 2019-04-29 MED ORDER — IOHEXOL 300 MG/ML  SOLN
100.0000 mL | Freq: Once | INTRAMUSCULAR | Status: AC | PRN
  Administered 2019-04-29: 100 mL via INTRAVENOUS

## 2019-04-29 MED ORDER — DOXYCYCLINE HYCLATE 100 MG PO CAPS: 100.0000 mg | ORAL_CAPSULE | Freq: Two times a day (BID) | ORAL | 0 refills | Status: AC

## 2019-04-29 MED ORDER — ONDANSETRON HCL 4 MG/2ML IJ SOLN
4.0000 mg | Freq: Once | INTRAMUSCULAR | Status: AC
  Administered 2019-04-29: 4 mg via INTRAVENOUS
  Filled 2019-04-29: qty 2

## 2019-04-29 MED ORDER — ONDANSETRON HCL 4 MG PO TABS: 4.0000 mg | ORAL_TABLET | Freq: Four times a day (QID) | ORAL | 0 refills | Status: DC

## 2019-04-29 MED ORDER — SODIUM CHLORIDE 0.9% FLUSH: 3.0000 mL | Freq: Once | INTRAVENOUS | Status: DC

## 2019-04-29 MED ORDER — DOXYCYCLINE HYCLATE 100 MG PO TABS
100.0000 mg | ORAL_TABLET | Freq: Once | ORAL | Status: AC
  Administered 2019-04-29: 15:00:00 100 mg via ORAL
  Filled 2019-04-29: qty 1

## 2019-04-29 MED ORDER — METRONIDAZOLE IN NACL 5-0.79 MG/ML-% IV SOLN
500.0000 mg | Freq: Once | INTRAVENOUS | Status: AC
  Administered 2019-04-29: 500 mg via INTRAVENOUS
  Filled 2019-04-29: qty 100

## 2019-04-29 MED ORDER — SODIUM CHLORIDE 0.9 % IV SOLN
100.0000 mg | Freq: Once | INTRAVENOUS | Status: DC
  Filled 2019-04-29: qty 100

## 2019-04-29 MED ORDER — KETOROLAC TROMETHAMINE 30 MG/ML IJ SOLN
30.0000 mg | Freq: Once | INTRAMUSCULAR | Status: AC
  Administered 2019-04-29: 14:00:00 30 mg via INTRAVENOUS
  Filled 2019-04-29: qty 1

## 2019-04-29 MED ORDER — METRONIDAZOLE 500 MG PO TABS: 500.0000 mg | ORAL_TABLET | Freq: Two times a day (BID) | ORAL | 0 refills | Status: AC

## 2019-04-29 MED ORDER — CEFTRIAXONE SODIUM 500 MG IJ SOLR
500.0000 mg | Freq: Once | INTRAMUSCULAR | Status: AC
  Administered 2019-04-29: 15:00:00 500 mg via INTRAMUSCULAR
  Filled 2019-04-29: qty 500

## 2019-04-29 MED ORDER — KETOROLAC TROMETHAMINE 60 MG/2ML IM SOLN: 60.0000 mg | Freq: Once | INTRAMUSCULAR | Status: DC

## 2019-04-29 NOTE — ED Notes (Signed)
Pt returned from US

## 2019-04-29 NOTE — Discharge Instructions (Addendum)
You were seen in the ER for lower pelvic pain.  It is likely that your symptoms are due to pelvic inflammatory disease which can be caused by gonorrhea/chlamydia.  Your GC and Chlamydia swabs are pending, it may take up to 24 hours for them to come back.  You may check this via MyChart.  In the meantime we will treat you prophylactically with an injection called Rocephin today in the ER.  We will send you home with 2 different antibiotics, one called doxycycline which you will need to take twice a day for 14 days and another called Flagyl which she will also need to take twice a day for 14 days.  Make sure to finish the course of the antibiotics to prevent antibiotic resistance.  These medications can cause nausea and diarrhea.  I have provided an antinausea medication called Zofran, use as needed.  Make sure to wait at least 2 weeks for sexual intercourse, and must let your sexual partners know and have them tested as well.  Make sure to return to the ER if your symptoms worsen, you develop a fever or increasing abdominal pain

## 2019-04-29 NOTE — ED Notes (Signed)
Patient verbalizes understanding of discharge instructions. Opportunity for questioning and answers were provided. Armband removed by staff, pt discharged from ED.  

## 2019-04-29 NOTE — ED Triage Notes (Signed)
Pt reports after sex last night she has severe lower abdomen pain with some bleeding but also just finished her cycle. Pt would like to be checked for pregnancy as she does not use protection. Pt did notice some discharged right before her period but states this normally happens. urination makes pain worse in lower abdomen.

## 2019-04-29 NOTE — ED Provider Notes (Addendum)
[place] EMERGENCY DEPARTMENT Provider Note   CSN: [id number] Arrival date & time: 04/29/19  9767     History Chief Complaint  Patient presents with  . Abdominal Pain    Nancy May is a 24 y.o. female.  HPI 24 year old female with a history of bacterial vaginosis presents to the ER for lower abdominal pain which started yesterday after sexual intercourse.  She does not recall an inciting incident during intercourse, states that she started to develop this pain shortly after.  She describes the pain as a lower abdominal cramping that wraps around her back, worse with movement.  She says any kind of straining, including urination makes the pain worse.  States "I feel like I cannot move" due to the pain, rating it a 9/10.  She has not taken anything for this pain.  Aggravating factors include movement and laying on her back.  No alleviating factors.  The patient is lying on her side in the room.  She is requesting a pregnancy test, though she does not suspect that she is pregnant.  She does not have any history of STDs.  She states that she has been with the same partner for the last 3 years.  She denies any fevers, nausea, vomiting, diarrhea, abnormal vaginal discharge, odors, bleeding, dysuria, hematuria, constipation.  LMP 04/28/19.      Past Medical History:  Diagnosis Date  . Bacterial vaginosis     Patient Active Problem List   Diagnosis Date Noted  . Pilonidal sinus 11/28/2018    History reviewed. No pertinent surgical history.   OB History   No obstetric history on file.     Family History  Problem Relation Age of Onset  . Heart failure Mother     Social History   Tobacco Use  . Smoking status: Never Smoker  . Smokeless tobacco: Never Used  Substance Use Topics  . Alcohol use: No  . Drug use: No    Home Medications Prior to Admission medications   Medication Sig Start Date End Date Taking? Authorizing Provider  doxycycline  (VIBRAMYCIN) 100 MG capsule Take 1 capsule (100 mg total) by mouth 2 (two) times daily for 14 days. 04/29/19 05/13/19  Garald Balding, PA-C  metroNIDAZOLE (FLAGYL) 500 MG tablet Take 1 tablet (500 mg total) by mouth 2 (two) times daily for 14 days. 04/29/19 05/13/19  Garald Balding, PA-C  ondansetron (ZOFRAN) 4 MG tablet Take 1 tablet (4 mg total) by mouth every 6 (six) hours. 04/29/19   Garald Balding, PA-C    Allergies    Patient has no known allergies.  Review of Systems   Review of Systems  Constitutional: Negative for chills and fever.  Respiratory: Negative for cough and shortness of breath.   Cardiovascular: Negative for chest pain and palpitations.  Gastrointestinal: Positive for abdominal pain. Negative for blood in stool, diarrhea, nausea and vomiting.  Genitourinary: Positive for frequency and pelvic pain. Negative for dysuria, menstrual problem, vaginal bleeding, vaginal discharge and vaginal pain.  Musculoskeletal: Positive for back pain. Negative for myalgias, neck pain and neck stiffness.  Skin: Negative for rash.  Neurological: Negative for dizziness, syncope, numbness and headaches.  Psychiatric/Behavioral: Negative for confusion.    Physical Exam Updated Vital Signs BP 113/78 (BP Location: Right Arm)   Pulse 75   Temp 98.2 F (36.8 C) (Oral)   Resp 18   Ht 5\' 7"  (1.702 m)   Wt 81.6 kg   LMP 04/21/2019   SpO2  99%   BMI 28.19 kg/m   Physical Exam Vitals reviewed. Exam conducted with a chaperone present.  Constitutional:      Appearance: Normal appearance. She is normal weight. She is not toxic-appearing.  HENT:     Head: Normocephalic and atraumatic.     Mouth/Throat:     Mouth: Mucous membranes are moist.     Pharynx: Oropharynx is clear.  Eyes:     General:        Right eye: No discharge.        Left eye: No discharge.     Extraocular Movements: Extraocular movements intact.     Conjunctiva/sclera: Conjunctivae normal.  Cardiovascular:     Rate and  Rhythm: Normal rate and regular rhythm.     Pulses: Normal pulses.     Heart sounds: Normal heart sounds.  Abdominal:     General: Bowel sounds are normal. There is distension.     Palpations: Abdomen is soft.     Tenderness: There is abdominal tenderness in the suprapubic area. There is guarding. There is no right CVA tenderness or left CVA tenderness. Negative signs include McBurney's sign.     Hernia: No hernia is present.     Comments: Tenderness and guarding to the mid suprapubic area.  No focal tenderness.  Genitourinary:    General: Normal vulva.     Exam position: Knee-chest position.     Vagina: Vaginal discharge present.     Cervix: Cervical motion tenderness, discharge and erythema present.     Adnexa:        Right: Tenderness present.        Left: Tenderness present.      Comments: Piercing above the clitoris.  Purulent green/yellow discharge and vaginal vault.  Cervical motion tenderness with adnexal tenderness bilaterally.  Musculoskeletal:        General: No swelling. Normal range of motion.  Neurological:     General: No focal deficit present.     Mental Status: She is alert and oriented to person, place, and time.  Psychiatric:        Mood and Affect: Mood normal.        Behavior: Behavior normal.     ED Results / Procedures / Treatments   Labs (all labs ordered are listed, but only abnormal results are displayed) Labs Reviewed  WET PREP, GENITAL - Abnormal; Notable for the following components:      Result Value   WBC, Wet Prep HPF POC MANY (*)    All other components within normal limits  COMPREHENSIVE METABOLIC PANEL - Abnormal; Notable for the following components:   Total Protein 6.2 (*)    Albumin 3.4 (*)    All other components within normal limits  URINALYSIS, ROUTINE W REFLEX MICROSCOPIC - Abnormal; Notable for the following components:   APPearance HAZY (*)    Hgb urine dipstick SMALL (*)    Leukocytes,Ua LARGE (*)    WBC, UA >50 (*)    All  other components within normal limits  LIPASE, BLOOD  CBC  I-STAT BETA HCG BLOOD, ED ([facility], [facility], [facility] ONLY)  GC/CHLAMYDIA PROBE AMP ([place]    EKG None  Radiology CT ABDOMEN PELVIS W CONTRAST  Result Date: 04/29/2019 CLINICAL DATA:  Acute lower abdominal pain. EXAM: CT ABDOMEN AND PELVIS WITH CONTRAST TECHNIQUE: Multidetector CT imaging of the abdomen and pelvis was performed using the standard protocol following bolus administration of intravenous contrast. CONTRAST:  100mL OMNIPAQUE IOHEXOL 300  MG/ML  SOLN COMPARISON:  Ultrasound of same day. FINDINGS: Lower chest: No acute abnormality. Hepatobiliary: No focal liver abnormality is seen. No gallstones, gallbladder wall thickening, or biliary dilatation. Pancreas: Unremarkable. No pancreatic ductal dilatation or surrounding inflammatory changes. Spleen: Normal in size without focal abnormality. Adrenals/Urinary Tract: Adrenal glands are unremarkable. Kidneys are normal, without renal calculi, focal lesion, or hydronephrosis. Bladder is unremarkable. Stomach/Bowel: Stomach is within normal limits. Appendix appears normal. No evidence of bowel wall thickening, distention, or inflammatory changes. Vascular/Lymphatic: There appears to be moderate narrowing of the proximal portion of the superior mesenteric artery which may represent artifact, but further evaluation with duplex ultrasound is recommended. No other vascular abnormality is noted. No adenopathy is noted. Reproductive: Uterus and bilateral adnexa are unremarkable. Other: Small amount of free fluid is noted in the posterior portion of the pelvis which most likely is physiologic. No definite hernia is noted. Musculoskeletal: No acute or significant osseous findings. IMPRESSION: 1. There appears to be moderate narrowing of the proximal portion of the superior mesenteric artery which may represent artifact, but further evaluation with duplex ultrasound is recommended. 2. Small amount of  free fluid is noted in the posterior portion of the pelvis which most likely is physiologic. 3. No other abnormality seen in the abdomen or pelvis. Electronically Signed   By: Lupita Raider M.D.   On: 04/29/2019 13:50   US PELVIC COMPLETE W TRANSVAGINAL AND TORSION R/O  Result Date: 04/29/2019 CLINICAL DATA:  LEFT adnexal pain and tenderness, less RIGHT-side pelvic pain, symptoms since last night; patient reports LMP 04/25/2019 EXAM: TRANSABDOMINAL AND TRANSVAGINAL ULTRASOUND OF PELVIS DOPPLER ULTRASOUND OF OVARIES TECHNIQUE: Both transabdominal and transvaginal ultrasound examinations of the pelvis were performed. Transabdominal technique was performed for global imaging of the pelvis including uterus, ovaries, adnexal regions, and pelvic cul-de-sac. It was necessary to proceed with endovaginal exam following the transabdominal exam to visualize the uterus, endometrium, and ovaries. Color and duplex Doppler ultrasound was utilized to evaluate blood flow to the ovaries. COMPARISON:  None FINDINGS: Uterus Measurements: 8.8 x 4.8 x 5.8 cm = volume: 129 mL. Retroverted and retroflexed. Normal morphology without mass. Endometrium Thickness: 5 mm.  No endometrial fluid or focal abnormality Right ovary Measurements: 3.6 x 2.6 x 2.3 cm = volume: 11.4 mL. Normal morphology without mass. Blood flow present within RIGHT ovary on color Doppler imaging. Left ovary Measurements: 3.3 x 2.0 x 2.4 cm = volume: 8.1 mL. Normal morphology without mass. Internal blood flow present on color Doppler imaging. Pulsed Doppler evaluation of both ovaries demonstrates normal low-resistance arterial and venous waveforms. Other findings Small amount of complicated appearing free pelvic fluid identified especially at cul-de-sac. No adnexal masses. IMPRESSION: Small amount of complicated free pelvic fluid identified at cul-de-sac containing scattered internal echoes. Remainder of exam unremarkable. Specifically, no evidence of ovarian mass  or torsion. Electronically Signed   By: Ulyses Southward M.D.   On: 04/29/2019 11:43    Procedures Procedures (including critical care time)  Medications Ordered in ED Medications  sodium chloride flush (NS) 0.9 % injection 3 mL (has no administration in time range)  metroNIDAZOLE (FLAGYL) IVPB 500 mg (500 mg Intravenous New Bag/Given 04/29/19 1430)  doxycycline (VIBRA-TABS) tablet 100 mg (has no administration in time range)  iohexol (OMNIPAQUE) 300 MG/ML solution 100 mL (100 mLs Intravenous Contrast Given 04/29/19 1322)  cefTRIAXone (ROCEPHIN) injection 500 mg (500 mg Intramuscular Given 04/29/19 1446)  ondansetron (ZOFRAN) injection 4 mg (4 mg Intravenous Given 04/29/19 1430)  ketorolac (  TORADOL) 30 MG/ML injection 30 mg (30 mg Intravenous Given 04/29/19 1429)    ED Course  I have reviewed the triage vital signs and the nursing notes.  Pertinent labs & imaging results that were available during my care of the patient were reviewed by me and considered in my medical decision making (see chart for details).    MDM Rules/Calculators/A&P                     24 year old female presented to the ER for pelvic pain which wraps around her back which started yesterday after sexual intercourse. Presentation to the ER, patient appears in pain, laying on her side, hesitant to move.  Vitals nonconcerning.  Overall though she is well-appearing, nontoxic, in no acute distress.  Physical exam positive for guarding and tenderness in the pelvic region diffusely.  Pelvic exam positive for cervical motion tenderness and bilateral adnexal tenderness, with purulent green/yellow discharge in vaginal vault.  Differential diagnosis of her lower abdominal considerations include pelvic inflammatory disease, ectopic pregnancy, appendicitis, urinary calculi, primary dysmenorrhea, septic abortion, ruptured ovarian cyst or tumor, ovarian torsion, tubo-ovarian abscess, degeneration of fibroid, endometriosis, diverticulitis,  cystitis.   I-STAT beta-hCG negative, lipase normal.  CBC without leukocytosis, overall reassuring.  CMP without significant electrolyte abnormalities or renal dysfunction.  AST/ ALT normal.  Pelvic ultrasound significant for small complicated free fluid in the cul-de-sac with no abscess or torsion.  Given physical exam, I suspect PID, but I discussed findings with radiology, they did not see any evidence of dilated tubes which would suggest PID.  They did endorse that the fluid in the pelvis is complicated, and if they are inconclusive findings, they recommend possible follow-up CT abdome/ pelvis.  I will order this to rule out  other intra-abdominal causes of her pelvic pain.  CT shows moderate narrowing of the proximal portion of the superior mesenteric artery which may represent artifact.  Discussed case with Dr. Adela Lank, he has low suspicion SMA syndrome as her symptoms are not upper abdominal, and are exacerbated by eating.  He also recommends follow-up with OB/GYN.  Discussed case with faculty practice GYN MD on-call, discussed free fluid in the abdomen found on ultrasound and CT findings, he refers that this fluid is nothing to be concerned about, and that it could be consistent with PID.  Wet prep positive for many WBCs. Urinalysis positive for large leukocytes, more than 50 WBCs, suspect contaminated sample as the patient is not having any dysuria, though she is noting frequency with normal void volumes. GC/ Chlamydia pending.  Prescribed doxycycline which will cover for possible UTI as well as chlamydia. No clue cells/ trich on wet prep but given purulent discharge seen on physical exam will order Flagyl to cover for BV/trich etc. Zofran for nausea.   At this stage in the work-up, both CT and ultrasound  did not show any active tube dilation which suggest PID, abscess, torsion. CT negative for any other intra-abdominal causes of her pelvic pain including nephrolithiases, appendicitis, etc.  Despite  SMA narrowing on CT, patient is asymptomatic and is not having any upper abdominal pain or pain exacerbated by eating.  Based on physical exam findings and wet prep, suspect her symptoms are due to PID.  Overall work-up reassuring, patient is afebrile, without leukocytosis. Serial abdominal re-examination notes improvement in pelvic pain with Toradol.  Patient will receive initial dose of Rocephin, doxy, Flagyl with Zofran in the ED, will send home with remaining course of doxy  and Flagyl with Zofran for nausea.  Informed the patient that GC/chlamydia will take least 24 hours to come back.  We will treat her prophylactically because of this.  Informed patient that she needs to have all sexual partners tested for STDs. Encouraged PCP/OBGYN follow-up.  Strict return precautions given which included development of worsening abdominal pain, fever, increasing in vaginal discharge or bleeding.  The patient voices understanding and is agreeable to this plan.   Case was discussed with Dr. Adela Lank and faculty practice GYN MD on call.   Final Clinical Impression(s) / ED Diagnoses Final diagnoses:  PID (acute pelvic inflammatory disease)    Rx / DC Orders ED Discharge Orders         Ordered    ondansetron (ZOFRAN) 4 MG tablet  Every 6 hours     04/29/19 1421    doxycycline (VIBRAMYCIN) 100 MG capsule  2 times daily     04/29/19 1421    metroNIDAZOLE (FLAGYL) 500 MG tablet  2 times daily     04/29/19 1421           Mare Ferrari, PA-C 04/29/19 1508    Mare Ferrari, PA-C 04/29/19 1509    Melene Plan, DO 04/30/19 1500

## 2019-04-30 LAB — GC/CHLAMYDIA PROBE AMP (~~LOC~~) NOT AT ARMC
Chlamydia: NEGATIVE
Comment: NEGATIVE
Comment: NORMAL
Neisseria Gonorrhea: POSITIVE — AB

## 2019-09-03 DIAGNOSIS — Z202 Contact with and (suspected) exposure to infections with a predominantly sexual mode of transmission: Secondary | ICD-10-CM | POA: Insufficient documentation

## 2019-09-03 HISTORY — DX: Contact with and (suspected) exposure to infections with a predominantly sexual mode of transmission: Z20.2

## 2020-02-06 ENCOUNTER — Other Ambulatory Visit: Payer: Self-pay

## 2020-02-06 ENCOUNTER — Encounter (HOSPITAL_COMMUNITY): Payer: Self-pay | Admitting: Emergency Medicine

## 2020-02-06 ENCOUNTER — Emergency Department (HOSPITAL_COMMUNITY)
Admission: EM | Admit: 2020-02-06 | Discharge: 2020-02-06 | Disposition: A | Discharge status: Home / Self Care | Payer: BLUE CROSS/BLUE SHIELD | Attending: Emergency Medicine | Admitting: Emergency Medicine

## 2020-02-06 DIAGNOSIS — R110 Nausea: Secondary | ICD-10-CM | POA: Diagnosis not present

## 2020-02-06 DIAGNOSIS — R102 Pelvic and perineal pain: Secondary | ICD-10-CM | POA: Diagnosis present

## 2020-02-06 DIAGNOSIS — N739 Female pelvic inflammatory disease, unspecified: Secondary | ICD-10-CM | POA: Insufficient documentation

## 2020-02-06 DIAGNOSIS — N730 Acute parametritis and pelvic cellulitis: Secondary | ICD-10-CM

## 2020-02-06 LAB — LIPASE, BLOOD: Lipase: 25 U/L (ref 11–51)

## 2020-02-06 LAB — URINALYSIS, ROUTINE W REFLEX MICROSCOPIC
Bilirubin Urine: NEGATIVE
Glucose, UA: NEGATIVE mg/dL
Hgb urine dipstick: NEGATIVE
Ketones, ur: NEGATIVE mg/dL
Nitrite: NEGATIVE
Protein, ur: NEGATIVE mg/dL
Specific Gravity, Urine: 1.026 (ref 1.005–1.030)
pH: 5 (ref 5.0–8.0)

## 2020-02-06 LAB — COMPREHENSIVE METABOLIC PANEL
ALT: 14 U/L (ref 0–44)
AST: 16 U/L (ref 15–41)
Albumin: 3.8 g/dL (ref 3.5–5.0)
Alkaline Phosphatase: 66 U/L (ref 38–126)
Anion gap: 10 (ref 5–15)
BUN: 15 mg/dL (ref 6–20)
CO2: 24 mmol/L (ref 22–32)
Calcium: 9.2 mg/dL (ref 8.9–10.3)
Chloride: 106 mmol/L (ref 98–111)
Creatinine, Ser: 0.76 mg/dL (ref 0.44–1.00)
GFR, Estimated: >60 mL/min (ref >60)
Glucose, Bld: 95 mg/dL (ref 70–99)
Potassium: 3.6 mmol/L (ref 3.5–5.1)
Sodium: 140 mmol/L (ref 135–145)
Total Bilirubin: 0.3 mg/dL (ref 0.3–1.2)
Total Protein: 7 g/dL (ref 6.5–8.1)

## 2020-02-06 LAB — WET PREP, GENITAL
Sperm: NONE SEEN
Trich, Wet Prep: NONE SEEN
Yeast Wet Prep HPF POC: NONE SEEN

## 2020-02-06 LAB — CBC
HCT: 37.5 % (ref 36.0–46.0)
Hemoglobin: 12.4 g/dL (ref 12.0–15.0)
MCH: 32 pg (ref 26.0–34.0)
MCHC: 33.1 g/dL (ref 30.0–36.0)
MCV: 96.9 fL (ref 80.0–100.0)
Platelets: 215 10*3/uL (ref 150–400)
RBC: 3.87 MIL/uL (ref 3.87–5.11)
RDW: 13.2 % (ref 11.5–15.5)
WBC: 10.3 10*3/uL (ref 4.0–10.5)
nRBC: 0 % (ref 0.0–0.2)

## 2020-02-06 LAB — HCG, QUANTITATIVE, PREGNANCY: hCG, Beta Chain, Quant, S: <1 m[IU]/mL (ref <5)

## 2020-02-06 MED ORDER — ACETAMINOPHEN 500 MG PO TABS
1000.0000 mg | ORAL_TABLET | Freq: Once | ORAL | Status: AC
  Administered 2020-02-06: 1000 mg via ORAL
  Filled 2020-02-06: qty 2

## 2020-02-06 MED ORDER — METRONIDAZOLE 500 MG PO TABS: 500.0000 mg | ORAL_TABLET | Freq: Two times a day (BID) | ORAL | 0 refills | Status: DC

## 2020-02-06 MED ORDER — ONDANSETRON 4 MG PO TBDP
4.0000 mg | ORAL_TABLET | Freq: Once | ORAL | Status: AC
  Administered 2020-02-06: 4 mg via ORAL
  Filled 2020-02-06: qty 1

## 2020-02-06 MED ORDER — STERILE WATER FOR INJECTION IJ SOLN
INTRAMUSCULAR | Status: AC
  Filled 2020-02-06: qty 10

## 2020-02-06 MED ORDER — KETOROLAC TROMETHAMINE 60 MG/2ML IM SOLN
15.0000 mg | Freq: Once | INTRAMUSCULAR | Status: AC
  Administered 2020-02-06: 15 mg via INTRAMUSCULAR
  Filled 2020-02-06: qty 2

## 2020-02-06 MED ORDER — DOXYCYCLINE HYCLATE 100 MG PO TABS
100.0000 mg | ORAL_TABLET | Freq: Once | ORAL | Status: AC
  Administered 2020-02-06: 100 mg via ORAL
  Filled 2020-02-06: qty 1

## 2020-02-06 MED ORDER — ONDANSETRON 4 MG PO TBDP: ORAL_TABLET | ORAL | 0 refills | Status: DC

## 2020-02-06 MED ORDER — DOXYCYCLINE HYCLATE 100 MG PO CAPS: 100.0000 mg | ORAL_CAPSULE | Freq: Two times a day (BID) | ORAL | 0 refills | Status: DC

## 2020-02-06 MED ORDER — CEFTRIAXONE SODIUM 1 G IJ SOLR
500.0000 mg | Freq: Once | INTRAMUSCULAR | Status: AC
  Administered 2020-02-06: 500 mg via INTRAMUSCULAR
  Filled 2020-02-06: qty 10

## 2020-02-06 NOTE — ED Notes (Signed)
Urine culture sent.

## 2020-02-06 NOTE — ED Provider Notes (Signed)
[facility]-EMERGENCY DEPT Provider Note   CSN: [id number] Arrival date & time: 02/06/20  0320     History Chief Complaint  Patient presents with   Abdominal Pain    Nancy May is a 25 y.o. female.  25 yo F with a cc of lower abdominal pain vaginal discharge going on for the past couple days.  Has a history of the same.  At that time she was treated presumptively for an STD but she never got the results.  She has had some mild nausea with this.  Denies vaginal bleeding.  Denies chance of being pregnant.  The history is provided by the patient.  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 No pertinent surgical history.   OB History   No obstetric history on file.     Family History  Problem Relation Age of Onset   Heart failure Mother     Social History   Tobacco Use   Smoking status: Never Smoker   Smokeless tobacco: Never Used  Vaping Use   Vaping Use: Never used  Substance Use Topics   Alcohol use: No   Drug use: No    Home Medications Prior to Admission medications   Medication Sig Start Date End Date Taking? Authorizing Provider  doxycycline (VIBRAMYCIN) 100 MG capsule Take 1 capsule (100 mg total) by mouth 2 (two) times daily. One po bid x 7 days 02/06/20  Yes Melene Plan, DO  metroNIDAZOLE (FLAGYL) 500 MG tablet Take 1  tablet (500 mg total) by mouth 2 (two) times daily. One po bid x 7 days 02/06/20  Yes Melene Plan, DO  ondansetron (ZOFRAN ODT) 4 MG disintegrating tablet 4mg  ODT q4 hours prn nausea/vomit 02/06/20  Yes 02/08/20, DO    Allergies    Patient has no known allergies.  Review of Systems   Review of Systems  Constitutional: Negative for chills and fever.  HENT: Negative for congestion and rhinorrhea.   Eyes: Negative for redness and visual disturbance.  Respiratory: Negative for shortness of breath and wheezing.   Cardiovascular: Negative for chest pain and palpitations.  Gastrointestinal: Positive for abdominal pain. Negative for nausea and vomiting.  Genitourinary: Positive for vaginal discharge. Negative for dysuria and urgency.  Musculoskeletal: Negative for arthralgias and myalgias.  Skin: Negative for pallor and wound.  Neurological: Negative for dizziness and headaches.    Physical Exam Updated Vital Signs BP 107/74    Pulse 72    Temp 98.2 F (36.8 C) (Oral)    Resp 17    Ht 5\' 7"  (1.702 m)    Wt 81.6 kg    SpO2 100%    BMI 28.19 kg/m   Physical Exam Vitals and nursing note reviewed.  Constitutional:      General: She is not in acute distress.    Appearance: She is well-developed  and well-nourished. She is not diaphoretic.  HENT:     Head: Normocephalic and atraumatic.  Eyes:     Extraocular Movements: EOM normal.     Pupils: Pupils are equal, round, and reactive to light.  Cardiovascular:     Rate and Rhythm: Normal rate and regular rhythm.     Heart sounds: No murmur heard. No friction rub. No gallop.   Pulmonary:     Effort: Pulmonary effort is normal.     Breath sounds: No wheezing or rales.  Abdominal:     General: There is no distension.     Palpations: Abdomen is soft.     Tenderness: There is no abdominal tenderness.  Genitourinary:    Comments: Purulent discharge at the cervix.  No adnexal mass or tenderness. +CMT Musculoskeletal:        General: No  tenderness or edema.     Cervical back: Normal range of motion and neck supple.  Skin:    General: Skin is warm and dry.  Neurological:     Mental Status: She is alert and oriented to person, place, and time.  Psychiatric:        Mood and Affect: Mood and affect normal.        Behavior: Behavior normal.     ED Results / Procedures / Treatments   Labs (all labs ordered are listed, but only abnormal results are displayed) Labs Reviewed  WET PREP, GENITAL - Abnormal; Notable for the following components:      Result Value   Clue Cells Wet Prep HPF POC PRESENT (*)    WBC, Wet Prep HPF POC MANY (*)    All other components within normal limits  URINALYSIS, ROUTINE W REFLEX MICROSCOPIC - Abnormal; Notable for the following components:   APPearance HAZY (*)    Leukocytes,Ua MODERATE (*)    Bacteria, UA RARE (*)    All other components within normal limits  LIPASE, BLOOD  COMPREHENSIVE METABOLIC PANEL  CBC  HCG, QUANTITATIVE, PREGNANCY  RPR  HIV ANTIBODY (ROUTINE TESTING W REFLEX)  GC/CHLAMYDIA PROBE AMP ([place]) NOT AT [facility]    EKG None  Radiology No results found.  Procedures Procedures (including critical care time)  Medications Ordered in ED Medications  ketorolac (TORADOL) injection 15 mg (has no administration in time range)  acetaminophen (TYLENOL) tablet 1,000 mg (has no administration in time range)  cefTRIAXone (ROCEPHIN) injection 500 mg (has no administration in time range)  doxycycline (VIBRA-TABS) tablet 100 mg (has no administration in time range)    ED Course  I have reviewed the triage vital signs and the nursing notes.  Pertinent labs & imaging results that were available during my care of the patient were reviewed by me and considered in my medical decision making (see chart for details).    MDM Rules/Calculators/A&P                          24 yo F with a chief complaints of vaginal discharge and pelvic pain.  Feels similar to the last time  she came to the ED.  Was diagnosed with chlamydia though she never received a call back and stated no that she ever had a diagnosis.  Never had her partners tested or treated.  The patient's physical exam is concerning for an STD, she is agreeable at discharge of the cervix and cervical motion tenderness.  We will treat presumptively.  Also with BV.  We will treat.  12:18 PM:  I have discussed the diagnosis/risks/treatment options with the patient and believe the pt to be eligible for discharge home to follow-up with PCP. We also discussed returning to the ED immediately if new or worsening sx occur. We discussed the sx which are most concerning (e.g., sudden worsening pain, fever, inability to tolerate by mouth) that necessitate immediate return. Medications administered to the patient during their visit and any new prescriptions provided to the patient are listed below.  Medications given during this visit Medications  ketorolac (TORADOL) injection 15 mg (has no administration in time range)  acetaminophen (TYLENOL) tablet 1,000 mg (has no administration in time range)  cefTRIAXone (ROCEPHIN) injection 500 mg (has no administration in time range)  doxycycline (VIBRA-TABS) tablet 100 mg (has no administration in time range)     The patient appears reasonably screen and/or stabilized for discharge and I doubt any other medical condition or other [facility] requiring further screening, evaluation, or treatment in the ED at this time prior to discharge.   Final Clinical Impression(s) / ED Diagnoses Final diagnoses:  PID (acute pelvic inflammatory disease)    Rx / DC Orders ED Discharge Orders         Ordered    metroNIDAZOLE (FLAGYL) 500 MG tablet  2 times daily        02/06/20 1215    doxycycline (VIBRAMYCIN) 100 MG capsule  2 times daily        02/06/20 1215    ondansetron (ZOFRAN ODT) 4 MG disintegrating tablet        02/06/20 1215           Melene Plan, DO 02/06/20 1218

## 2020-02-06 NOTE — ED Triage Notes (Signed)
Patient complaining of abdominal pain. Patient states she has a hx of BV. Patient states she is having discharge. Patient states this started 3 days ago.

## 2020-02-06 NOTE — Discharge Instructions (Signed)
Your exam was concerning for a possible sexually transmitted disease.  I am treating with antibiotics to cover for the 2 most common STDs.  You should be notified if the test comes back positive.  You can also check this in my chart.  You should abstain from sexual contact until your antibiotics have been completed.  Please return to the ED for worsening pelvic pain or if you develop a fever.  The health department can also help you out for your sexually transmitted disease needs.

## 2020-02-06 NOTE — ED Notes (Signed)
Patient defers bloodwork at this time

## 2020-02-09 LAB — GC/CHLAMYDIA PROBE AMP (~~LOC~~) NOT AT ARMC
Chlamydia: POSITIVE — AB
Comment: NEGATIVE
Comment: NORMAL
Neisseria Gonorrhea: NEGATIVE

## 2020-04-15 DIAGNOSIS — Z8742 Personal history of other diseases of the female genital tract: Secondary | ICD-10-CM

## 2020-04-15 DIAGNOSIS — N760 Acute vaginitis: Secondary | ICD-10-CM | POA: Insufficient documentation

## 2020-04-15 HISTORY — DX: Personal history of other diseases of the female genital tract: Z87.42

## 2020-04-15 HISTORY — DX: Acute vaginitis: N76.0

## 2020-04-21 DIAGNOSIS — Z975 Presence of (intrauterine) contraceptive device: Secondary | ICD-10-CM | POA: Insufficient documentation

## 2020-04-21 HISTORY — DX: Presence of (intrauterine) contraceptive device: Z97.5

## 2020-11-24 DIAGNOSIS — N898 Other specified noninflammatory disorders of vagina: Secondary | ICD-10-CM | POA: Diagnosis not present

## 2020-11-24 DIAGNOSIS — Z118 Encounter for screening for other infectious and parasitic diseases: Secondary | ICD-10-CM | POA: Diagnosis not present

## 2020-11-24 DIAGNOSIS — N760 Acute vaginitis: Secondary | ICD-10-CM | POA: Diagnosis not present

## 2020-12-31 DIAGNOSIS — N760 Acute vaginitis: Secondary | ICD-10-CM | POA: Diagnosis not present

## 2020-12-31 DIAGNOSIS — N898 Other specified noninflammatory disorders of vagina: Secondary | ICD-10-CM | POA: Diagnosis not present

## 2021-02-03 DIAGNOSIS — N760 Acute vaginitis: Secondary | ICD-10-CM | POA: Diagnosis not present

## 2021-02-03 DIAGNOSIS — N898 Other specified noninflammatory disorders of vagina: Secondary | ICD-10-CM | POA: Diagnosis not present

## 2021-03-15 DIAGNOSIS — R895 Abnormal microbiological findings in specimens from other organs, systems and tissues: Secondary | ICD-10-CM | POA: Diagnosis not present

## 2021-03-15 DIAGNOSIS — N760 Acute vaginitis: Secondary | ICD-10-CM | POA: Diagnosis not present

## 2021-03-15 DIAGNOSIS — N898 Other specified noninflammatory disorders of vagina: Secondary | ICD-10-CM | POA: Diagnosis not present

## 2021-03-15 DIAGNOSIS — A499 Bacterial infection, unspecified: Secondary | ICD-10-CM | POA: Diagnosis not present

## 2021-05-13 ENCOUNTER — Ambulatory Visit (HOSPITAL_BASED_OUTPATIENT_CLINIC_OR_DEPARTMENT_OTHER)
Admission: RE | Admit: 2021-05-13 | Discharge: 2021-05-13 | Disposition: A | Discharge status: Home / Self Care | Payer: 59 | Source: Ambulatory Visit | Attending: Family Medicine | Admitting: Family Medicine

## 2021-05-13 ENCOUNTER — Ambulatory Visit: Payer: 59 | Admitting: Family Medicine

## 2021-05-13 ENCOUNTER — Encounter: Payer: Self-pay | Admitting: Family Medicine

## 2021-05-13 VITALS — BP 100/70 | HR 80 | Ht 67.0 in | Wt 207.6 lb

## 2021-05-13 DIAGNOSIS — G8929 Other chronic pain: Secondary | ICD-10-CM

## 2021-05-13 DIAGNOSIS — L219 Seborrheic dermatitis, unspecified: Secondary | ICD-10-CM | POA: Diagnosis not present

## 2021-05-13 DIAGNOSIS — M549 Dorsalgia, unspecified: Secondary | ICD-10-CM

## 2021-05-13 DIAGNOSIS — M25512 Pain in left shoulder: Secondary | ICD-10-CM | POA: Diagnosis not present

## 2021-05-13 DIAGNOSIS — Z8249 Family history of ischemic heart disease and other diseases of the circulatory system: Secondary | ICD-10-CM | POA: Diagnosis not present

## 2021-05-13 DIAGNOSIS — M546 Pain in thoracic spine: Secondary | ICD-10-CM | POA: Diagnosis not present

## 2021-05-13 DIAGNOSIS — M25511 Pain in right shoulder: Secondary | ICD-10-CM | POA: Diagnosis not present

## 2021-05-13 MED ORDER — PREDNISONE 20 MG PO TABS: 40.0000 mg | ORAL_TABLET | Freq: Every day | ORAL | 0 refills | Status: AC

## 2021-05-13 MED ORDER — KETOCONAZOLE 2 % EX SHAM: MEDICATED_SHAMPOO | CUTANEOUS | 11 refills | Status: DC

## 2021-05-13 MED ORDER — MELOXICAM 15 MG PO TABS: 15.0000 mg | ORAL_TABLET | Freq: Every day | ORAL | 0 refills | Status: DC

## 2021-05-13 MED ORDER — CYCLOBENZAPRINE HCL 5 MG PO TABS: 5.0000 mg | ORAL_TABLET | Freq: Three times a day (TID) | ORAL | 1 refills | Status: DC | PRN

## 2021-05-13 NOTE — Progress Notes (Signed)
? ?New Patient Office Visit ? ?Subjective   ? ?Patient ID: Nancy May, female    DOB: [birth year]  Age: 26 y.o. MRN: [id number] ? ?CC: establish care, upper back/neck pain, dandruff/dry skin  ? ? ?HPI ?[name] presents to establish care ? ? ?Neck/upper back pain: ?Onset: chronic, but progresively worsening; 3-4 days ago pain was excruciating on left side - relieved somewhat now but still difficult to turn to the left  ?Location: bilateral between both shoulder blades up to neck ?Duration: constant ?Characteristics: tight ?Aggravating factors: sleeping the wrong way and cold weather causes mores stiffness ?Alleviating factors: "popping it," stretching,  ?Radiating/associated symptoms: none ?Timing: constant ?Severity: baseline 5/10 at rest, up to 8-9/10 worse with movement/bending/twisting/turning, cold weather  ?Has not been taking much advil or tylenol  ? ? ?Skin/dandruff concerns: ?For the past several years she had struggled with significant dandruff on scalp and occasionally eye brows. States that sometimes it gets so severe that the skin at her hairline gets irritated as well. Describes dandruff as flaky and sometimes when she is scrubbing scalp she will cause erythema and scabbing. She denies any hair loss.  ? ? ?Sees OBGYN every month for recurrent BV, reports she is up to date on pap (normal in the past) - will request records. ? ?She also reports a significant family history for coronary artery disease with mom dying at age 10 and several other relatives with CAD. Reports she had cardiology workup at [facility] about 10 years ago including at least an EKG and Echo (she cannot remember if anything else). Reports workup at that time was normal, but she was encouraged to see a cardiologist every few years for screening purposes and she has not seen anyone since then. She requests referral today. No current symptoms. ? ? ? ?Outpatient Encounter Medications as of 05/13/2021  ?Medication Sig  ?  cyclobenzaprine (FLEXERIL) 5 MG tablet Take 1 tablet (5 mg total) by mouth 3 (three) times daily as needed for muscle spasms.  ? ketoconazole (NIZORAL) 2 % shampoo Apply to scalp twice a week for 8 weeks and then weekly thereafter  ? meloxicam (MOBIC) 15 MG tablet Take 1 tablet (15 mg total) by mouth daily.  ? predniSONE (DELTASONE) 20 MG tablet Take 2 tablets (40 mg total) by mouth daily with breakfast for 5 days.  ? [DISCONTINUED] doxycycline (VIBRAMYCIN) 100 MG capsule Take 1 capsule (100 mg total) by mouth 2 (two) times daily. One po bid x 7 days  ? [DISCONTINUED] metroNIDAZOLE (FLAGYL) 500 MG tablet Take 1 tablet (500 mg total) by mouth 2 (two) times daily. One po bid x 7 days  ? [DISCONTINUED] ondansetron (ZOFRAN ODT) 4 MG disintegrating tablet 4mg  ODT q4 hours prn nausea/vomit  ? ?No facility-administered encounter medications on file as of 05/13/2021.  ? ? ?Past Medical History:  ?Diagnosis Date  ? Bacterial vaginosis   ? ? ?History reviewed. No pertinent surgical history. ? ?Family History  ?Problem Relation Age of Onset  ? Drug abuse Mother   ? Early death Mother   ? Heart failure Mother   ? Drug abuse Father   ? Stroke Maternal Grandmother   ? Hypertension Maternal Grandmother   ? Hyperlipidemia Maternal Grandmother   ? Arthritis Maternal Grandmother   ? ? ?Social History  ? ?Socioeconomic History  ? Marital status: Single  ?  Spouse name: Not on file  ? Number of children: Not on file  ? Years of education: Not on file  ? Highest  education level: Not on file  ?Occupational History  ? Not on file  ?Tobacco Use  ? Smoking status: Never  ? Smokeless tobacco: Never  ?Vaping Use  ? Vaping Use: Never used  ?Substance and Sexual Activity  ? Alcohol use: Yes  ?  Alcohol/week: 3.0 standard drinks  ?  Types: 1 Glasses of wine, 2 Shots of liquor per week  ? Drug use: Yes  ?  Types: Marijuana  ? Sexual activity: Yes  ?  Birth control/protection: None, Implant  ?Other Topics Concern  ? Not on file  ?Social History  Narrative  ? Not on file  ? ?Social Determinants of Health  ? ?Financial Resource Strain: Not on file  ?Food Insecurity: Not on file  ?Transportation Needs: Not on file  ?Physical Activity: Not on file  ?Stress: Not on file  ?Social Connections: Not on file  ?Intimate Partner Violence: Not on file  ? ? ?ROS ?All review of systems negative except what is listed in the HPI ? ?  ? ? ?Objective   ? ?BP 100/70   Pulse 80   Ht 5\' 7"  (1.702 m)   Wt 207 lb 9.6 oz (94.2 kg)   BMI 32.51 kg/m?  ? ?Physical Exam ?Vitals reviewed.  ?Constitutional:   ?   General: She is not in acute distress. ?   Appearance: Normal appearance. She is obese. She is not ill-appearing.  ?Cardiovascular:  ?   Rate and Rhythm: Normal rate and regular rhythm.  ?   Pulses: Normal pulses.  ?   Heart sounds: Normal heart sounds.  ?Pulmonary:  ?   Effort: Pulmonary effort is normal.  ?   Breath sounds: Normal breath sounds.  ?Musculoskeletal:  ?   Cervical back: Normal range of motion and neck supple. No tenderness.  ?   Right lower leg: No edema.  ?   Left lower leg: No edema.  ?   Comments: Bilateral upper traps with muscle tension, full ROM   ?Lymphadenopathy:  ?   Cervical: No cervical adenopathy.  ?Skin: ?   General: Skin is warm and dry.  ?Neurological:  ?   General: No focal deficit present.  ?   Mental Status: She is alert and oriented to person, place, and time. Mental status is at baseline.  ?Psychiatric:     ?   Mood and Affect: Mood normal.     ?   Behavior: Behavior normal.     ?   Thought Content: Thought content normal.     ?   Judgment: Judgment normal.  ? ? ? ?  ? ?Assessment & Plan:  ? ?1. Upper back pain, chronic ?Xrays today given this has been going on for so long  ?Prednisone for 5 days then start the meloxicam  (do not take together or with other antiinflammatories like Advil/ibuprofen or Aleve) ?Muscle relaxer as needed ?Home exercises/stretches  ?Physical therapy referral  ?- DG Cervical Spine Complete; Future ?- DG Thoracic  Spine W/Swimmers; Future ?- Ambulatory referral to Physical Therapy ?- predniSONE (DELTASONE) 20 MG tablet; Take 2 tablets (40 mg total) by mouth daily with breakfast for 5 days.  Dispense: 10 tablet; Refill: 0 ?- meloxicam (MOBIC) 15 MG tablet; Take 1 tablet (15 mg total) by mouth daily.  Dispense: 30 tablet; Refill: 0 ?- cyclobenzaprine (FLEXERIL) 5 MG tablet; Take 1 tablet (5 mg total) by mouth 3 (three) times daily as needed for muscle spasms.  Dispense: 30 tablet; Refill: 1 ? ?2. Seborrheic dermatitis  of scalp ?Information sheet attached for you to review ?Antifungal shampoo sent in to try ?Dermatology referral sent over at your request ?- ketoconazole (NIZORAL) 2 % shampoo; Apply to scalp twice a week for 8 weeks and then weekly thereafter  Dispense: 120 mL; Refill: 11 ?- Ambulatory referral to Dermatology ? ?3. Family history of coronary artery disease ?No acute complaints.  ?- Ambulatory referral to Cardiology ? ? ?Please schedule a physical at your convenience so we can update your basic labs  ? ? ?Clayborne Danaaylor B Paisly Finger[place]ut, NP ? ? ?

## 2021-05-13 NOTE — Patient Instructions (Addendum)
Neck/back pain: ?Xrays today given this has been going on for so long  ?Prednisone for 5 days then start the meloxicam  (do not take together or with other antiinflammatories like Advil/ibuprofen or Aleve) ?Muscle relaxer as needed ?Home exercises/stretches  ?Physical therapy referral  ? ?Seborrheic Dermatitis: ?Information sheet attached for you to review ?Antifungal shampoo sent in to try ?Dermatology referral sent over at your request ? ?Family history of coronary artery disease: ?Referral to cardiology at your request ? ?Please schedule a physical at your convenience so we can update your basic labs  ? ? ? ? ? ?Thank you for choosing [facility] for your Primary Care needs. I am excited for the opportunity to partner with you to meet your health care goals. It was a pleasure meeting you today! ? ? ?Information on diet, exercise, and health maintenance recommendations are listed below. This is information to help you be sure you are on track for optimal health and monitoring.  ? ?Please look over this and let us know if you have any questions or if you have completed any of the health maintenance outside of [facility] so that we can be sure your records are up to date.  ?___________________________________________________________ ? ?MyChart:  ?For all urgent or time sensitive needs we ask that you please call the office to avoid delays. Our number is [phone]. ?MyChart is not constantly monitored and due to the large volume of messages a day, replies may take up to 72 business hours. ? ?MyChart Policy: ?MyChart allows for you to see your visit notes, after visit summary, provider recommendations, lab and tests results, make an appointment, request refills, and contact your provider or the office for non-urgent questions or concerns. Providers are seeing patients during normal business hours and do not have built in time to review MyChart messages.  ?We ask that you allow a  minimum of 3 business days for responses to [employer]. For this reason, please do not send urgent requests through MyChart. Please call the office at [phone]. ?New and ongoing conditions may require a visit. We have virtual and in-person visits available for your convenience.  ?Complex MyChart concerns may require a visit. Your provider may request you schedule a virtual or in-person visit to ensure we are providing the best care possible. ?MyChart messages sent after 11:00 AM on Friday will not be received by the provider until Monday morning.  ?  ?Lab and Test Results: ?You will receive your lab and test results on MyChart as soon as they are completed and results have been sent by the lab or testing facility. Due to this service, you will receive your results BEFORE your provider.  ?I review lab and test results each morning prior to seeing patients. Some results require collaboration with other providers to ensure you are receiving the most appropriate care. For this reason, we ask that you please allow a minimum of 3-5 business days from the time that ALL results have been received for your provider to receive and review lab and test results and contact you about these.  ?Most lab and test result comments from the provider will be sent through MyChart. Your provider may recommend changes to the plan of care, follow-up visits, repeat testing, ask questions, or request an office visit to discuss these results. You may reply directly to this message or call the office to provide information for the provider or set up an appointment. ?In some instances,  you will be called with test results and recommendations. Please let us know if this is preferred and we will make note of this in your chart to provide this for you.    ?If you have not heard a response to your lab or test results in 5 business days from all results returning to MyChart, please call the office to let us know. We ask that you please  avoid calling prior to this time unless there is an emergent concern. Due to high call volumes, this can delay the resulting process. ? ?After Hours: ?For all non-emergency after hours needs, please call the office at [phone] and select the option to reach the on-call  service. On-call services are shared between multiple [facility] offices and therefore it will not be possible to speak directly with your provider. On-call providers may provide medical advice and recommendations, but are unable to provide refills for maintenance medications.  ?For all emergency or urgent medical needs after normal business hours, we recommend that you seek care at the closest Urgent Care or Emergency Department to ensure appropriate treatment in a timely manner.  ?[facility] [place] has a 24 hour emergency room located on the ground floor for your convenience.  ? ?Urgent Concerns During the Business Day ?Providers are seeing patients from 8AM to 5PM with a busy schedule and are most often not able to respond to non-urgent calls until the end of the day or the next business day. ?If you should have URGENT concerns during the day, please call and speak to the nurse or schedule a same day appointment so that we can address your concern without delay.  ? ?Thank you, again, for choosing me as your health care partner. I appreciate your trust and look forward to learning more about you.  ? ?Lollie Marrow. Reola Calkins, DNP, FNP-C ? ?___________________________________________________________ ? ?Health Maintenance Recommendations ?Screening Testing ?Mammogram ?Every 1-2 years based on history and risk factors ?Starting at age 47 ?Pap Smear ?Ages 21-39 every 3 years ?Ages 25-65 every 5 years with HPV testing ?More frequent testing may be required based on results and history ?Colon Cancer Screening ?Every 1-10 years based on test performed, risk factors, and history ?Starting at age 83 ?Bone Density Screening ?Every 2-10 years  based on history ?Starting at age 67 for women ?Recommendations for men differ based on medication usage, history, and risk factors ?AAA Screening ?One time ultrasound ?Men 48-76 years old who have ever smoked ?Lung Cancer Screening ?Low Dose Lung CT every 12 months ?Age 86-80 years with a 20 pack-year smoking history who still smoke or who have quit within the last 15 years ? ?Screening Labs ?Routine  Labs: Complete Blood Count (CBC), Complete Metabolic Panel (CMP), Cholesterol (Lipid Panel) ?Every 6-12 months based on history and medications ?May be recommended more frequently based on current conditions or previous results ?Hemoglobin A1c Lab ?Every 3-12 months based on history and previous results ?Starting at age 45 or earlier with diagnosis of diabetes, high cholesterol, BMI >26, and/or risk factors ?Frequent monitoring for patients with diabetes to ensure blood sugar control ?Thyroid Panel (TSH w/ T3 & T4) ?Every 6 months based on history, symptoms, and risk factors ?May be repeated more often if on medication ?HIV ?One time testing for all patients 4 and older ?May be repeated more frequently for patients with increased risk factors or exposure ?Hepatitis C ?One time testing for all patients 14 and older ?May be repeated more frequently for patients with increased  risk factors or exposure ?Gonorrhea, Chlamydia ?Every 12 months for all sexually active persons 13-24 years ?Additional monitoring may be recommended for those who are considered high risk or who have symptoms ?PSA ?Men 140-26 years old with risk factors ?Additional screening may be recommended from age 26-69 based on risk factors, symptoms, and history ? ?Vaccine Recommendations ?Tetanus Booster ?All adults every 10 years ?Flu Vaccine ?All patients 6 months and older every year ?COVID Vaccine ?All patients 12 years and older ?Initial dosing with booster ?May recommend additional booster based on age and health history ?HPV Vaccine ?2 doses all  patients age 8-26 ?Dosing may be considered for patients over 26 ?Shingles Vaccine (Shingrix) ?2 doses all adults 50 years and older ?Pneumonia (Pneumovax 23) ?All adults 65 years and older ?May recommend earlie

## 2021-05-13 NOTE — Progress Notes (Signed)
Skin breakouts ?Upper back pain, couldn't turn head to the left the other day ?

## 2021-05-16 ENCOUNTER — Encounter: Payer: Self-pay | Admitting: *Deleted

## 2021-05-20 ENCOUNTER — Encounter: Payer: 59 | Admitting: Family Medicine

## 2021-05-24 ENCOUNTER — Ambulatory Visit: Payer: BLUE CROSS/BLUE SHIELD | Admitting: Family Medicine

## 2021-05-31 ENCOUNTER — Telehealth: Payer: Self-pay | Admitting: Family Medicine

## 2021-05-31 NOTE — Telephone Encounter (Signed)
Pt notified of instructions

## 2021-05-31 NOTE — Telephone Encounter (Signed)
Pt stated she was advised to take medications in a certain order, and she would like to know what that order is. Please advise.  ?

## 2021-06-14 NOTE — Progress Notes (Deleted)
    Referring-Taylor Reola Calkins, NP Reason for referral-family history of coronary artery disease  HPI: 26 year old female for evaluation of family history of coronary artery disease at request of Hyman Hopes, NP.  Current Outpatient Medications  Medication Sig Dispense Refill   cyclobenzaprine (FLEXERIL) 5 MG tablet Take 1 tablet (5 mg total) by mouth 3 (three) times daily as needed for muscle spasms. 30 tablet 1   ketoconazole (NIZORAL) 2 % shampoo Apply to scalp twice a week for 8 weeks and then weekly thereafter 120 mL 11   meloxicam (MOBIC) 15 MG tablet Take 1 tablet (15 mg total) by mouth daily. 30 tablet 0   No current facility-administered medications for this visit.    No Known Allergies   Past Medical History:  Diagnosis Date   Bacterial vaginosis     No past surgical history on file.  Social History   Socioeconomic History   Marital status: Single    Spouse name: Not on file   Number of children: Not on file   Years of education: Not on file   Highest education level: Not on file  Occupational History   Not on file  Tobacco Use   Smoking status: Never   Smokeless tobacco: Never  Vaping Use   Vaping Use: Never used  Substance and Sexual Activity   Alcohol use: Yes    Alcohol/week: 3.0 standard drinks    Types: 1 Glasses of wine, 2 Shots of liquor per week   Drug use: Yes    Types: Marijuana   Sexual activity: Yes    Birth control/protection: None, Implant  Other Topics Concern   Not on file  Social History Narrative   Not on file   Social Determinants of Health   Financial Resource Strain: Not on file  Food Insecurity: Not on file  Transportation Needs: Not on file  Physical Activity: Not on file  Stress: Not on file  Social Connections: Not on file  Intimate Partner Violence: Not on file    Family History  Problem Relation Age of Onset   Drug abuse Mother    Early death Mother    Heart failure Mother    Drug abuse Father    Stroke Maternal  Grandmother    Hypertension Maternal Grandmother    Hyperlipidemia Maternal Grandmother    Arthritis Maternal Grandmother     ROS: no fevers or chills, productive cough, hemoptysis, dysphasia, odynophagia, melena, hematochezia, dysuria, hematuria, rash, seizure activity, orthopnea, PND, pedal edema, claudication. Remaining systems are negative.  Physical Exam:   There were no vitals taken for this visit.  General:  Well developed/well nourished in NAD Skin warm/dry Patient not depressed No peripheral clubbing Back-normal HEENT-normal/normal eyelids Neck supple/normal carotid upstroke bilaterally; no bruits; no JVD; no thyromegaly chest - CTA/ normal expansion CV - RRR/normal S1 and S2; no murmurs, rubs or gallops;  PMI nondisplaced Abdomen -NT/ND, no HSM, no mass, + bowel sounds, no bruit 2+ femoral pulses, no bruits Ext-no edema, chords, 2+ DP Neuro-grossly nonfocal  ECG - personally reviewed  A/P  1 family history of coronary artery disease-  Olga Millers, MD

## 2021-06-17 ENCOUNTER — Other Ambulatory Visit: Payer: Self-pay | Admitting: Family Medicine

## 2021-06-17 DIAGNOSIS — G8929 Other chronic pain: Secondary | ICD-10-CM

## 2021-06-22 ENCOUNTER — Ambulatory Visit: Payer: 59 | Admitting: Cardiology

## 2021-08-16 ENCOUNTER — Ambulatory Visit: Payer: 59 | Admitting: Cardiology

## 2021-08-22 ENCOUNTER — Ambulatory Visit: Payer: 59 | Admitting: Family

## 2021-08-24 ENCOUNTER — Ambulatory Visit (INDEPENDENT_AMBULATORY_CARE_PROVIDER_SITE_OTHER): Payer: 59 | Admitting: Family Medicine

## 2021-08-24 VITALS — BP 121/76 | HR 92 | Ht 67.0 in | Wt 211.8 lb

## 2021-08-24 DIAGNOSIS — R112 Nausea with vomiting, unspecified: Secondary | ICD-10-CM

## 2021-08-24 DIAGNOSIS — E669 Obesity, unspecified: Secondary | ICD-10-CM

## 2021-08-24 DIAGNOSIS — N760 Acute vaginitis: Secondary | ICD-10-CM | POA: Insufficient documentation

## 2021-08-24 DIAGNOSIS — R109 Unspecified abdominal pain: Secondary | ICD-10-CM

## 2021-08-24 LAB — COMPREHENSIVE METABOLIC PANEL
ALT: 15 U/L (ref 0–35)
AST: 17 U/L (ref 0–37)
Albumin: 4.1 g/dL (ref 3.5–5.2)
Alkaline Phosphatase: 69 U/L (ref 39–117)
BUN: 11 mg/dL (ref 6–23)
CO2: 25 mEq/L (ref 19–32)
Calcium: 9.2 mg/dL (ref 8.4–10.5)
Chloride: 103 mEq/L (ref 96–112)
Creatinine, Ser: 0.81 mg/dL (ref 0.40–1.20)
GFR: 100.11 mL/min (ref >60.00)
Glucose, Bld: 93 mg/dL (ref 70–99)
Potassium: 4 mEq/L (ref 3.5–5.1)
Sodium: 140 mEq/L (ref 135–145)
Total Bilirubin: 0.2 mg/dL (ref 0.2–1.2)
Total Protein: 6.7 g/dL (ref 6.0–8.3)

## 2021-08-24 LAB — CBC
HCT: 39.8 % (ref 36.0–46.0)
Hemoglobin: 13.3 g/dL (ref 12.0–15.0)
MCHC: 33.4 g/dL (ref 30.0–36.0)
MCV: 97.5 fl (ref 78.0–100.0)
Platelets: 227 10*3/uL (ref 150.0–400.0)
RBC: 4.08 Mil/uL (ref 3.87–5.11)
RDW: 13.8 % (ref 11.5–15.5)
WBC: 7 10*3/uL (ref 4.0–10.5)

## 2021-08-24 LAB — HEMOGLOBIN A1C: Hgb A1c MFr Bld: 5.5 % (ref 4.6–6.5)

## 2021-08-24 LAB — LIPASE: Lipase: 27 U/L (ref 11.0–59.0)

## 2021-08-24 NOTE — Progress Notes (Deleted)
Cardiology Office Note:    Date:  08/24/2021   ID:  Nancy May, DOB 1995/11/20, MRN [id number]  PCP:  Clayborne Dana, NP  Cardiologist:  Norman Herrlich, MD   Referring MD: Clayborne Dana, NP  ASSESSMENT:    No diagnosis found. PLAN:    In order of problems listed above:  ***  Next appointment   Medication Adjustments/Labs and Tests Ordered: Current medicines are reviewed at length with the patient today.  Concerns regarding medicines are outlined above.  No orders of the defined types were placed in this encounter.  No orders of the defined types were placed in this encounter.    No chief complaint on file. ***  History of Present Illness:    Nancy May is a 26 y.o. female who is being seen today for the evaluation of cardiovascular risk at the request of Clayborne Dana, NP.   Past Medical History:  Diagnosis Date   Allergic rhinitis 05/01/2012   Bacterial vaginosis    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 02/07/20 Retreated 3/24 based on similar presenting symptoms, however benign exam   Lower urinary tract infectious disease 05/01/2012   Nexplanon in place 04/21/2020   Formatting of this note might be different from the original. Nexplanon placed 04/21/2020   Pilonidal sinus 11/28/2018   Possible exposure to STD 09/03/2019   Recurrent vaginitis 04/15/2020   Formatting of this note might be different from the original. Multiple bouts of bacterial vaginosis and feels she cannot clear infection. Wet prep with clue cells, negative whiff 04/08/20 Treat empirically for BV and metrogel for suppression to follow (she was prescribed in the past but did not use for more than 2 months, will plan for 4 - 6 months maintenance of remission after initial treatment).    No past surgical history on file.  Current Medications: No outpatient medications have been marked as taking for the 08/25/21 encounter (Appointment) with  Baldo Daub, MD.     Allergies:   Patient has no known allergies.   Social History   Socioeconomic History   Marital status: Single    Spouse name: Not on file   Number of children: Not on file   Years of education: Not on file   Highest education level: Not on file  Occupational History   Not on file  Tobacco Use   Smoking status: Never   Smokeless tobacco: Never  Vaping Use   Vaping Use: Never used  Substance and Sexual Activity   Alcohol use: Yes    Alcohol/week: 3.0 standard drinks of alcohol    Types: 1 Glasses of wine, 2 Shots of liquor per week   Drug use: Yes    Types: Marijuana   Sexual activity: Yes    Birth control/protection: None, Implant  Other Topics Concern   Not on file  Social History Narrative   Not on file   Social Determinants of Health   Financial Resource Strain: Not on file  Food Insecurity: Not on file  Transportation Needs: Not on file  Physical Activity: Not on file  Stress: Not on file  Social Connections: Not on file     Family History: The patient's ***family history includes Arthritis in her maternal grandmother; Drug abuse in her father and mother; Early death in her mother; Heart failure in her mother; Hyperlipidemia in her maternal grandmother; Hypertension in her maternal grandmother; Stroke in her maternal grandmother.  ROS:  ROS Please see the history of present illness.    *** All other systems reviewed and are negative.  EKGs/Labs/Other Studies Reviewed:    The following studies were reviewed today: ***  EKG:  EKG is *** ordered today.  The ekg ordered today is personally reviewed and demonstrates ***  Recent Labs: No results found for requested labs within last 365 days.  Recent Lipid Panel No results found for: "CHOL", "TRIG", "HDL", "CHOLHDL", "VLDL", "LDLCALC", "LDLDIRECT"  Physical Exam:    VS:  There were no vitals taken for this visit.    Wt Readings from Last 3 Encounters:  08/24/21 211 lb 12.8 oz  (96.1 kg)  05/13/21 207 lb 9.6 oz (94.2 kg)  02/06/20 180 lb (81.6 kg)     GEN: *** Well nourished, well developed in no acute distress HEENT: Normal NECK: No JVD; No carotid bruits LYMPHATICS: No lymphadenopathy CARDIAC: ***RRR, no murmurs, rubs, gallops RESPIRATORY:  Clear to auscultation without rales, wheezing or rhonchi  ABDOMEN: Soft, non-tender, non-distended MUSCULOSKELETAL:  No edema; No deformity  SKIN: Warm and dry NEUROLOGIC:  Alert and oriented x 3 PSYCHIATRIC:  Normal affect     Signed, Norman Herrlich, MD  08/24/2021 5:08 PM    [facility]

## 2021-08-24 NOTE — Progress Notes (Signed)
   Acute Office Visit  Subjective:     Patient ID: Nancy May, female    DOB: May 30, 1995, 26 y.o.   MRN: [id number]  CC: chronic nausea   HPI Patient is in today for chronic nausea.  Patient reports that for several years she has noticed that if she does not eat frequently enough she will start to feel nauseous. However, this year her symptoms seem to be getting worse. Now if she waits to long, the nausea will not go away with eating and she will end up vomiting after eating. Because of these ongoing symptoms, she has to eat frequent meals and snacks throughout the day. She denies any significant abdominal pain, but does report frequent "hunger pains" prior to the nausea and will occasionally have epigastric/chest/abdominal burning, gnawing discomfort that is equally bad on an empty or full stomach. She has not had any changes to her bowel movements - reports they are regular without blood, mucus, constipation, diarrhea. Reports she has been gaining weight lately as she has been trying to eat more frequently - but she tries to eat healthy. She denies any pelvic, vaginal, GU symptoms.     ROS All review of systems negative except what is listed in the HPI      Objective:    BP 121/76   Pulse 92   Ht 5\' 7"  (1.702 m)   Wt 211 lb 12.8 oz (96.1 kg)   BMI 33.17 kg/m    Physical Exam Vitals reviewed.  Constitutional:      Appearance: Normal appearance.  Cardiovascular:     Rate and Rhythm: Normal rate and regular rhythm.  Pulmonary:     Effort: Pulmonary effort is normal.     Breath sounds: Normal breath sounds.  Abdominal:     General: Abdomen is flat. Bowel sounds are normal. There is no distension.     Palpations: Abdomen is soft. There is no mass.     Tenderness: There is no abdominal tenderness. There is no guarding or rebound.  Neurological:     General: No focal deficit present.     Mental Status: She is alert and oriented to person, place, and time. Mental status  is at baseline.  Psychiatric:        Mood and Affect: Mood normal.        Behavior: Behavior normal.        Thought Content: Thought content normal.        Judgment: Judgment normal.     No results found for any visits on 08/24/21.      Assessment & Plan:   1. Abdominal discomfort 2. Nausea and vomiting, unspecified vomiting type 3. Obesity (BMI 30-39.9) No alarm findings on exam. Will start with lab work and refer to GI pending results and symptom progression. Recommend she try cutting back on marijuana use to see if that helps decrease nausea episodes. Encouraged healthy, balanced meals to prevent blood sugar spiking/dropping too rapidly. Patient aware of signs/symptoms requiring further/urgent evaluation.   - CBC - Comprehensive metabolic panel - H. pylori breath test - Lipase - Hemoglobin A1c     No orders of the defined types were placed in this encounter.   Return if symptoms worsen or fail to improve, for (pending results).  10/24/21, NP

## 2021-08-24 NOTE — Patient Instructions (Signed)
Frequent nausea, vomiting, hunger: - Recommend cutting back on marijuana - Labs today - Encourage balanced, healthy meals - complex carb, protein, adequate hydration, etc.  - If initial labs/workup unrevealing, we can refer to GI

## 2021-08-25 ENCOUNTER — Ambulatory Visit: Payer: 59 | Admitting: Cardiology

## 2021-08-29 ENCOUNTER — Encounter: Payer: Self-pay | Admitting: Cardiology

## 2021-08-29 ENCOUNTER — Ambulatory Visit: Payer: 59 | Admitting: Cardiology

## 2021-08-29 DIAGNOSIS — F419 Anxiety disorder, unspecified: Secondary | ICD-10-CM | POA: Diagnosis not present

## 2021-08-29 DIAGNOSIS — Z8249 Family history of ischemic heart disease and other diseases of the circulatory system: Secondary | ICD-10-CM | POA: Diagnosis not present

## 2021-08-29 DIAGNOSIS — R69 Illness, unspecified: Secondary | ICD-10-CM | POA: Diagnosis not present

## 2021-08-29 DIAGNOSIS — R0609 Other forms of dyspnea: Secondary | ICD-10-CM | POA: Diagnosis not present

## 2021-08-29 LAB — H. PYLORI BREATH TEST: H. pylori Breath Test: NOT DETECTED

## 2021-08-29 NOTE — Progress Notes (Signed)
Cardiology Consultation:    Date:  08/29/2021   ID:  Nancy May, DOB Jul 05, 1995, MRN [id number]  PCP:  Clayborne Dana, NP  Cardiologist:  Gypsy Balsam, MD   Referring MD: Clayborne Dana, NP   Chief Complaint  Patient presents with   family history w/ heart disease    Occasional chest pain. Not symptomatic at this time     History of Present Illness:    Nancy May is a 26 y.o. female who is being seen today for the evaluation of dyspnea exertion, chest pain at the request of Clayborne Dana, NP.  She is a healthy woman however she does have family history of premature coronary artery disease apparently her mother died when patient was 38 years old and apparently she was dancing started having some symptoms passed out and she died shortly after that.  She also got multiple additional family members especially at her aunts went up having coronary artery disease stenting at the very early early age.  Some of been small but not all of them.  She is worried about her future and she would like to be established as a patient make sure everything is fine there.  She is trying to exercise on the regular basis gets short of breath quite easily she blames it on the bandemia that she was not able to exercise at that time and also the fact that she gained some weight.  But she is trying gradually to come back to exercise routine.  Described to have some atypical chest pain not related to exercise.  She does not know what her cholesterol is, she smokes marijuana on the regular basis daily, does not use much alcohol.  Does have family history of premature coronary artery disease  Past Medical History:  Diagnosis Date   Allergic rhinitis 05/01/2012   Bacterial vaginosis    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 02/07/20 Retreated 3/24 based on similar presenting symptoms, however benign exam   Lower urinary tract infectious disease 05/01/2012    Nexplanon in place 04/21/2020   Formatting of this note might be different from the original. Nexplanon placed 04/21/2020   Pilonidal sinus 11/28/2018   Possible exposure to STD 09/03/2019   Recurrent vaginitis 04/15/2020   Formatting of this note might be different from the original. Multiple bouts of bacterial vaginosis and feels she cannot clear infection. Wet prep with clue cells, negative whiff 04/08/20 Treat empirically for BV and metrogel for suppression to follow (she was prescribed in the past but did not use for more than 2 months, will plan for 4 - 6 months maintenance of remission after initial treatment).    Past Surgical History:  Procedure Laterality Date   NO PAST SURGERIES      Current Medications: No outpatient medications have been marked as taking for the 08/29/21 encounter (Office Visit) with Georgeanna Lea, MD.     Allergies:   Patient has no known allergies.   Social History   Socioeconomic History   Marital status: Single    Spouse name: Not on file   Number of children: Not on file   Years of education: Not on file   Highest education level: Not on file  Occupational History   Not on file  Tobacco Use   Smoking status: Never   Smokeless tobacco: Never  Vaping Use   Vaping Use: Never used  Substance and Sexual Activity  Alcohol use: Yes    Alcohol/week: 3.0 standard drinks of alcohol    Types: 1 Glasses of wine, 2 Shots of liquor per week   Drug use: Yes    Types: Marijuana   Sexual activity: Yes    Birth control/protection: None, Implant  Other Topics Concern   Not on file  Social History Narrative   Not on file   Social Determinants of Health   Financial Resource Strain: Not on file  Food Insecurity: Not on file  Transportation Needs: Not on file  Physical Activity: Not on file  Stress: Not on file  Social Connections: Not on file     Family History: The patient's family history includes Arthritis in her maternal grandmother; Drug  abuse in her father and mother; Early death in her mother; Heart disease in her father; Heart failure in her mother; Hyperlipidemia in her maternal grandmother; Hypertension in her maternal grandmother; Stroke in her maternal grandmother. ROS:   Please see the history of present illness.    All 14 point review of systems negative except as described per history of present illness.  EKGs/Labs/Other Studies Reviewed:    The following studies were reviewed today:   EKG:  EKG is  ordered today.  The ekg ordered today demonstrates normal sinus rhythm normal P interval normal QS complex duration morphology  Recent Labs: 08/24/2021: ALT 15; BUN 11; Creatinine, Ser 0.81; Hemoglobin 13.3; Platelets 227.0; Potassium 4.0; Sodium 140  Recent Lipid Panel No results found for: "CHOL", "TRIG", "HDL", "CHOLHDL", "VLDL", "LDLCALC", "LDLDIRECT"  Physical Exam:    VS:  BP 112/60 (BP Location: Left Arm, Patient Position: Sitting)   Pulse 60   Ht 5\' 7"  (1.702 m)   Wt 212 lb (96.2 kg)   SpO2 99%   BMI 33.20 kg/m     Wt Readings from Last 3 Encounters:  08/29/21 212 lb (96.2 kg)  08/24/21 211 lb 12.8 oz (96.1 kg)  05/13/21 207 lb 9.6 oz (94.2 kg)     GEN:  Well nourished, well developed in no acute distress HEENT: Normal NECK: No JVD; No carotid bruits LYMPHATICS: No lymphadenopathy CARDIAC: RRR, no murmurs, no rubs, no gallops RESPIRATORY:  Clear to auscultation without rales, wheezing or rhonchi  ABDOMEN: Soft, non-tender, non-distended MUSCULOSKELETAL:  No edema; No deformity  SKIN: Warm and dry NEUROLOGIC:  Alert and oriented x 3 PSYCHIATRIC:  Normal affect   ASSESSMENT:    1. Dyspnea on exertion   2. Family history of coronary artery disease   3. Anxiety    PLAN:    In order of problems listed above:  Dyspnea on exertion.  Could be deconditioning however she started exercising on the regular basis in spite of that still shortness of breath is quite prominent.  I will ask her to  have echocardiogram done to assess left ventricle ejection fraction. Family history of premature coronary artery disease.  Obviously we will check her cholesterol.  We will do LP(a) as well as high-sensitivity C-reactive protein based on that we will be able to advise about cholesterol management. To she does have therapies that she talk to about once a month.  That seems to be helping.  But she is very worried about her future she is very worried about potentially having heart problem. We went great deal of time talk about healthy lifestyle.  I recommended 5 times a week at least 30 minutes moderate intensity exercise.  We discussed basic of Mediterranean diet.   Medication Adjustments/Labs and Tests  Ordered: Current medicines are reviewed at length with the patient today.  Concerns regarding medicines are outlined above.  No orders of the defined types were placed in this encounter.  No orders of the defined types were placed in this encounter.   Signed, Georgeanna Lea, MD, [facility]. 08/29/2021 9:27 AM    [facility]

## 2021-08-29 NOTE — Patient Instructions (Signed)
Medication Instructions:  Your physician recommends that you continue on your current medications as directed. Please refer to the Current Medication list given to you today.  *If you need a refill on your cardiac medications before your next appointment, please call your pharmacy*   Lab Work: Lipid panel, [facility] [address] If you have labs (blood work) drawn today and your tests are completely normal, you will receive your results only by: MyChart Message (if you have MyChart) OR A paper copy in the mail If you have any lab test that is abnormal or we need to change your treatment, we will call you to review the results.   Testing/Procedures: Your physician has requested that you have an echocardiogram. Echocardiography is a painless test that uses sound waves to create images of your heart. It provides your doctor with information about the size and shape of your heart and how well your heart's chambers and valves are working. This procedure takes approximately one hour. There are no restrictions for this procedure.    Follow-Up: At [facility], you and your health needs are our priority.  As part of our continuing mission to provide you with exceptional heart care, we have created designated Provider Care Teams.  These Care Teams include your primary Cardiologist (physician) and Advanced Practice Providers (APPs -  Physician Assistants and Nurse Practitioners) who all work together to provide you with the care you need, when you need it.  We recommend signing up for the patient portal called "MyChart".  Sign up information is provided on this After Visit Summary.  MyChart is used to connect with patients for Virtual Visits (Telemedicine).  Patients are able to view lab/test results, encounter notes, upcoming appointments, etc.  Non-urgent messages can be sent to your provider as well.   To learn more about what you can do with MyChart, go to [email or web address].    Your  next appointment:   6 month(s)  The format for your next appointment:   In Person  Provider:   Gypsy Balsam, MD    Other Instructions NA

## 2021-08-30 ENCOUNTER — Encounter: Payer: Self-pay | Admitting: *Deleted

## 2021-08-30 LAB — LIPID PANEL
Chol/HDL Ratio: 3.8 ratio (ref 0.0–4.4)
Cholesterol, Total: 193 mg/dL (ref 100–199)
HDL: 51 mg/dL (ref >39)
LDL Chol Calc (NIH): 127 mg/dL — ABNORMAL HIGH (ref 0–99)
Triglycerides: 81 mg/dL (ref 0–149)
VLDL Cholesterol Cal: 15 mg/dL (ref 5–40)

## 2021-08-30 LAB — LIPOPROTEIN A (LPA): Lipoprotein (a): 143 nmol/L — ABNORMAL HIGH (ref <75.0)

## 2021-09-02 ENCOUNTER — Telehealth: Payer: Self-pay

## 2021-09-02 NOTE — Telephone Encounter (Signed)
Sent message via my chart regarding results per DPR- per Dr. Vanetta Shawl note. Pt had already viewed results on my chart. Encouraged to call with any questions. Routed to PCP.

## 2021-10-14 DIAGNOSIS — N898 Other specified noninflammatory disorders of vagina: Secondary | ICD-10-CM | POA: Diagnosis not present

## 2021-11-01 ENCOUNTER — Ambulatory Visit (HOSPITAL_BASED_OUTPATIENT_CLINIC_OR_DEPARTMENT_OTHER)
Admission: RE | Admit: 2021-11-01 | Discharge: 2021-11-01 | Disposition: A | Discharge status: Home / Self Care | Payer: 59 | Source: Ambulatory Visit | Attending: Cardiology | Admitting: Cardiology

## 2021-11-01 DIAGNOSIS — I361 Nonrheumatic tricuspid (valve) insufficiency: Secondary | ICD-10-CM | POA: Diagnosis not present

## 2021-11-01 DIAGNOSIS — R0609 Other forms of dyspnea: Secondary | ICD-10-CM

## 2021-11-01 DIAGNOSIS — Z8249 Family history of ischemic heart disease and other diseases of the circulatory system: Secondary | ICD-10-CM | POA: Diagnosis not present

## 2021-11-01 LAB — ECHOCARDIOGRAM COMPLETE
AR max vel: 2.12 cm2
AV Area VTI: 2.07 cm2
AV Area mean vel: 1.96 cm2
AV Mean grad: 7 mmHg
AV Peak grad: 10.1 mmHg
Ao pk vel: 1.59 m/s
Area-P 1/2: 3.63 cm2
S' Lateral: 3.2 cm

## 2021-11-01 NOTE — Progress Notes (Signed)
  Echocardiogram 2D Echocardiogram has been performed.  Merrie Roof F 11/01/2021, 11:40 AM

## 2021-11-21 ENCOUNTER — Ambulatory Visit (HOSPITAL_BASED_OUTPATIENT_CLINIC_OR_DEPARTMENT_OTHER)
Admission: RE | Admit: 2021-11-21 | Discharge: 2021-11-21 | Disposition: A | Discharge status: Home / Self Care | Payer: 59 | Source: Ambulatory Visit | Attending: Family | Admitting: Family

## 2021-11-21 ENCOUNTER — Ambulatory Visit (INDEPENDENT_AMBULATORY_CARE_PROVIDER_SITE_OTHER): Payer: 59 | Admitting: Family

## 2021-11-21 VITALS — BP 113/64 | HR 64 | Temp 98.8°F | Resp 16 | Wt 214.0 lb

## 2021-11-21 DIAGNOSIS — S99911A Unspecified injury of right ankle, initial encounter: Secondary | ICD-10-CM | POA: Diagnosis not present

## 2021-11-21 DIAGNOSIS — M25571 Pain in right ankle and joints of right foot: Secondary | ICD-10-CM | POA: Diagnosis not present

## 2021-11-21 NOTE — Assessment & Plan Note (Signed)
New.  Recommended X-ray to rule out fracture. Pt was given a lace up ankle brace today and not provided for her employer.

## 2021-11-21 NOTE — Progress Notes (Signed)
Subjective:   By signing my name below, I, Nancy May, attest that this documentation has been prepared under the direction and in the presence of Sandford Craze, NP. 11/21/2021    Patient ID: Nancy May, female    DOB: 27-May-1995, 26 y.o.   MRN: [id number]  Chief Complaint  Patient presents with   Ankle Pain    Patient here for ankle pain bilaterally after falling "a few weeks ago"    Ankle Pain    Patient is in today for a office visit.   She complains of right ankle pain. She also has pain in her left as well during this visit.  She step down from her stairs in an awkward position and hurt her right ankle. She has a history of ankle pain due to dancing when younger and having her ankle stepped on. She has worn a brace in the past. She has pain while putting on and taking off her shoes. She has pain at work while wearing sneakers due to her company not letting her wear a brace.    Health Maintenance Due  Topic Date Due   COVID-19 Vaccine (1) Never done   HPV VACCINES (1 - 2-dose series) Never done   HIV Screening  Never done   Hepatitis C Screening  Never done   TETANUS/TDAP  Never done   PAP-Cervical Cytology Screening  Never done   PAP SMEAR-Modifier  Never done   INFLUENZA VACCINE  Never done    Past Medical History:  Diagnosis Date   Allergic rhinitis 05/01/2012   Bacterial vaginosis    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 02/07/20 Retreated 3/24 based on similar presenting symptoms, however benign exam   Lower urinary tract infectious disease 05/01/2012   Nexplanon in place 04/21/2020   Formatting of this note might be different from the original. Nexplanon placed 04/21/2020   Pilonidal sinus 11/28/2018   Possible exposure to STD 09/03/2019   Recurrent vaginitis 04/15/2020   Formatting of this note might be different from the original. Multiple bouts of bacterial vaginosis and feels she cannot clear  infection. Wet prep with clue cells, negative whiff 04/08/20 Treat empirically for BV and metrogel for suppression to follow (she was prescribed in the past but did not use for more than 2 months, will plan for 4 - 6 months maintenance of remission after initial treatment).    Past Surgical History:  Procedure Laterality Date   NO PAST SURGERIES      Family History  Problem Relation Age of Onset   Drug abuse Mother    Early death Mother    Heart failure Mother    Drug abuse Father    Heart disease Father    Stroke Maternal Grandmother    Hypertension Maternal Grandmother    Hyperlipidemia Maternal Grandmother    Arthritis Maternal Grandmother     Social History   Socioeconomic History   Marital status: Single    Spouse name: Not on file   Number of children: Not on file   Years of education: Not on file   Highest education level: Not on file  Occupational History   Not on file  Tobacco Use   Smoking status: Never   Smokeless tobacco: Never  Vaping Use   Vaping Use: Never used  Substance and Sexual Activity   Alcohol use: Yes    Alcohol/week: 3.0 standard drinks of alcohol    Types: 1 Glasses  of wine, 2 Shots of liquor per week   Drug use: Yes    Types: Marijuana   Sexual activity: Yes    Birth control/protection: None, Implant  Other Topics Concern   Not on file  Social History Narrative   Not on file   Social Determinants of Health   Financial Resource Strain: Not on file  Food Insecurity: Not on file  Transportation Needs: Not on file  Physical Activity: Not on file  Stress: Not on file  Social Connections: Not on file  Intimate Partner Violence: Not on file    No outpatient medications prior to visit.   No facility-administered medications prior to visit.    No Known Allergies  Review of Systems  Musculoskeletal:        (+)ankle pain in both legs, (R>L)       Objective:    Physical Exam Constitutional:      General: She is not in acute  distress.    Appearance: Normal appearance. She is not ill-appearing.  HENT:     Head: Normocephalic and atraumatic.     Right Ear: External ear normal.     Left Ear: External ear normal.  Eyes:     Extraocular Movements: Extraocular movements intact.     Pupils: Pupils are equal, round, and reactive to light.  Cardiovascular:     Rate and Rhythm: Normal rate and regular rhythm.     Heart sounds: Normal heart sounds. No murmur heard.    No gallop.  Pulmonary:     Effort: Pulmonary effort is normal. No respiratory distress.     Breath sounds: Normal breath sounds. No wheezing or rales.  Musculoskeletal:     Right ankle: No swelling. Tenderness (Tenderness in top of the right foot) present.     Left ankle: No swelling.  Skin:    General: Skin is warm and dry.  Neurological:     Mental Status: She is alert and oriented to person, place, and time.  Psychiatric:        Judgment: Judgment normal.     BP 113/64 (BP Location: Right Arm, Patient Position: Sitting, Cuff Size: Small)   Pulse 64   Temp 98.8 F (37.1 C) (Oral)   Resp 16   Wt 214 lb (97.1 kg)   SpO2 99%   BMI 33.52 kg/m  Wt Readings from Last 3 Encounters:  11/21/21 214 lb (97.1 kg)  08/29/21 212 lb (96.2 kg)  08/24/21 211 lb 12.8 oz (96.1 kg)       Assessment & Plan:   Problem List Items Addressed This Visit       Unprioritized   Injury of right ankle - Primary    New.  Recommended X-ray to rule out fracture. Pt was given a lace up ankle brace today and not provided for her employer.       Relevant Orders   DG Ankle Complete Right (Completed)     No orders of the defined types were placed in this encounter.   I, Nance Pear, NP, personally preformed the services described in this documentation.  All medical record entries made by the scribe were at my direction and in my presence.  I have reviewed the chart and discharge instructions (if applicable) and agree that the record reflects my  personal performance and is accurate and complete. 11/21/2021   I,Nancy May,acting as a scribe for Nance Pear, NP.,have documented all relevant documentation on the behalf of Nance Pear, NP,as  directed by  Nance Pear, NP while in the presence of Nance Pear, NP.   Nance Pear, NP

## 2021-11-30 ENCOUNTER — Telehealth: Payer: Self-pay

## 2021-11-30 NOTE — Progress Notes (Deleted)
   Acute Office Visit  Subjective:     Patient ID: Nancy May, female    DOB: [birth year], 26 y.o.   MRN: [id number]  No chief complaint on file.   Patient was seen on 11/21/21 for ankle injury after a fall a few weeks prior. At that visit she was given a lace-up brace. Xray was negative.   Today ***    ROS      Objective:    There were no vitals taken for this visit. {Vitals History (Optional):23777}  Physical Exam  No results found for any visits on 12/01/21.      Assessment & Plan:   Problem List Items Addressed This Visit   None   No orders of the defined types were placed in this encounter.   No follow-ups on file.  Clayborne Dana, NP

## 2021-11-30 NOTE — Telephone Encounter (Signed)
Appt scheduled tomorrow

## 2021-11-30 NOTE — Telephone Encounter (Signed)
Nurse Assessment Nurse: Nancy Medal, RN, Nancy May Date/Time Nancy May Time): 11/30/2021 9:12:31 AM Confirm and document reason for call. If symptomatic, describe symptoms. ---Caller states she has pain in both ankles. It started when she fell 3 weeks ago. No bruising or swelling then or now. She was seen on Monday. Dr. did x-rays. No fracture. Pt. feels like they should have done more. Pt. did not want to do more Ibuprofen. Ankle brace is not doing anything. Sharp pinch in front of ankle every time she is walking. Does not hurt as much when not moving. Does the patient have any new or worsening symptoms? ---Yes Will a triage be completed? ---Yes Related visit to physician within the last 2 weeks? ---Yes Does the PT have any chronic conditions? (i.e. diabetes, asthma, this includes High risk factors for pregnancy, etc.) ---No Is the patient pregnant or possibly pregnant? (Ask all females between the ages of 59-55) ---No Is this a behavioral health or substance abuse call? ---No Guidelines Guideline Title Affirmed Question Affirmed Notes Nurse Date/Time Nancy May Time) Ankle Pain [1] SEVERE pain (e.g., excruciating, unable to walk) AND [2] not improved Lovelace, RN, Nancy May 11/30/2021 9:15:26 AM PLEASE NOTE: All timestamps contained within this report are represented as [place] Standard Time. CONFIDENTIALTY NOTICE: This fax transmission is intended only for the addressee. It contains information that is legally privileged, confidential or otherwise protected from use or disclosure. If you are not the intended recipient, you are strictly prohibited from reviewing, disclosing, copying using or disseminating any of this information or taking any action in reliance on or regarding this information. If you have received this fax in error, please notify us immediately by telephone so that we can arrange for its return to [place]. Phone: [phone], Toll-Free: [phone], Fax: [phone] Page: 2 of  2 Call Id: [id number] Guidelines Guideline Title Affirmed Question Affirmed Notes Nurse Date/Time Nancy May Time) after 2 hours of pain medicine Disp. Time Nancy May Time) Disposition Final User 11/30/2021 8:47:00 AM Send To Nurse House Sink, RN, April 11/30/2021 9:18:05 AM See HCP within 4 Hours (or PCP triage) Yes Lovelace, RN, Nancy May Final Disposition 11/30/2021 9:18:05 AM See HCP within 4 Hours (or PCP triage) Yes Lovelace, RN, Nancy May Caller Disagree/Comply Comply Caller Understands Yes PreDisposition InappropriateToAsk Care Advice Given Per Guideline SEE HCP (OR PCP TRIAGE) WITHIN 4 HOURS: * IF OFFICE WILL BE OPEN: You need to be seen within the next 3 or 4 hours. Call your doctor (or NP/PA) now or as soon as the office opens. CARE ADVICE given per Ankle Pain (Adult) guideline. Referrals REFERRED TO PCP OFFICE

## 2021-12-01 ENCOUNTER — Ambulatory Visit: Payer: 59 | Admitting: Family Medicine

## 2021-12-05 ENCOUNTER — Encounter: Payer: Self-pay | Admitting: Family Medicine

## 2021-12-05 ENCOUNTER — Encounter: Payer: Self-pay | Admitting: *Deleted

## 2021-12-05 ENCOUNTER — Ambulatory Visit: Payer: 59 | Admitting: Family Medicine

## 2021-12-05 VITALS — BP 118/84 | HR 74 | Temp 98.5°F | Resp 18 | Ht 67.0 in | Wt 213.6 lb

## 2021-12-05 DIAGNOSIS — M79671 Pain in right foot: Secondary | ICD-10-CM | POA: Diagnosis not present

## 2021-12-05 DIAGNOSIS — M79672 Pain in left foot: Secondary | ICD-10-CM

## 2021-12-05 DIAGNOSIS — L853 Xerosis cutis: Secondary | ICD-10-CM | POA: Diagnosis not present

## 2021-12-05 MED ORDER — MELOXICAM 15 MG PO TABS: 15.0000 mg | ORAL_TABLET | Freq: Every day | ORAL | 0 refills | Status: DC

## 2021-12-05 NOTE — Progress Notes (Signed)
Acute Office Visit  Subjective:     Patient ID: Nancy May, female    DOB: 21-Sep-1995, 26 y.o.   MRN: [id number]  Chief Complaint  Patient presents with   Ankle Pain    Bilateral ankle pain, pain has not improved since last visit. No swelling    Ankle Pain    Patient is in today for bilateral foot pain.   Patient fell about a month ago, rolling both of her ankles. She reports the right foot hurts worse than left and pain is more dorsally. She saw Sandford Craze NP at the beginning of the month for right ankle pain and was given a brace and had a negative xray. She reports the brace tends to make the dorsal aspect of foot hurt worse, but she does try to wear it occasionally. She has not been able to rest and ice like she would like due to her busy schedule. Reports most of the pain is dorsally, proximal to the ankle. Right foot is 8/10 and left is 4/10 aching. She has not had any swelling or bruising and is able to bear weight.    She also reports ongoing dryness to her face. She tried an antifungal shampoo/wash for her scalp and that helped the scalp but not her facial dryness. She has been trying moisturizer with minimal improvement. Weather/seasons do not seem to make a difference. She would like referral to dermatology.    ROS All review of systems negative except what is listed in the HPI      Objective:    BP 118/84 (BP Location: Left Arm, Patient Position: Sitting, Cuff Size: Normal)   Pulse 74   Temp 98.5 F (36.9 C) (Oral)   Resp 18   Ht 5\' 7"  (1.702 m)   Wt 213 lb 9.6 oz (96.9 kg)   SpO2 97%   BMI 33.45 kg/m    Physical Exam Vitals reviewed.  Constitutional:      Appearance: Normal appearance.  Musculoskeletal:        General: No swelling or tenderness. Normal range of motion.  Skin:    General: Skin is warm and dry.  Neurological:     General: No focal deficit present.     Mental Status: She is alert and oriented to person, place, and time.   Psychiatric:        Mood and Affect: Mood normal.        Behavior: Behavior normal.        Thought Content: Thought content normal.        Judgment: Judgment normal.     No results found for any visits on 12/05/21.      Assessment & Plan:   Problem List Items Addressed This Visit   None Visit Diagnoses     Pain in both feet    -  Primary - continue brace if you can tolerate - adding Meloxicam (do not use with any other antiinflammatories like Advil or Aleve) - gentle stretches, ice, heat - referral to PT    Relevant Medications   meloxicam (MOBIC) 15 MG tablet   Other Relevant Orders   Ambulatory referral to Physical Therapy   Dry skin     - gentle cleansers, pat dry, then moisturize (Cetaphil, Eucerin, Aveeno, etc). - referral to dermatology     Relevant Orders   Ambulatory referral to Dermatology       Meds ordered this encounter  Medications   meloxicam (MOBIC) 15 MG tablet  Sig: Take 1 tablet (15 mg total) by mouth daily.    Dispense:  30 tablet    Refill:  0    Order Specific Question:   Supervising Provider    Answer:   Danise Edge A [[id number]]    Return if symptoms worsen or fail to improve.  Clayborne Dana, NP

## 2021-12-05 NOTE — Patient Instructions (Signed)
Ankle pain: - continue brace if you can tolerate - adding Meloxicam (do not use with any other antiinflammatories like Advil or Aleve) - gentle stretches, ice, heat - referral to PT  Dry skin: - gentle cleansers, pat dry, then moisturize (Cetaphil, Eucerin, Aveeno, etc). - referral to dermatology

## 2021-12-26 NOTE — Therapy (Incomplete)
OUTPATIENT PHYSICAL THERAPY LOWER EXTREMITY EVALUATION   Patient Name: Nancy May MRN: [id number] DOB:10/22/1995, 26 y.o., female Today's Date: 12/26/2021  END OF SESSION:   Past Medical History:  Diagnosis Date   Allergic rhinitis 05/01/2012   Bacterial vaginosis    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 02/07/20 Retreated 3/24 based on similar presenting symptoms, however benign exam   Lower urinary tract infectious disease 05/01/2012   Nexplanon in place 04/21/2020   Formatting of this note might be different from the original. Nexplanon placed 04/21/2020   Pilonidal sinus 11/28/2018   Possible exposure to STD 09/03/2019   Recurrent vaginitis 04/15/2020   Formatting of this note might be different from the original. Multiple bouts of bacterial vaginosis and feels she cannot clear infection. Wet prep with clue cells, negative whiff 04/08/20 Treat empirically for BV and metrogel for suppression to follow (she was prescribed in the past but did not use for more than 2 months, will plan for 4 - 6 months maintenance of remission after initial treatment).   Past Surgical History:  Procedure Laterality Date   NO PAST SURGERIES     Patient Active Problem List   Diagnosis Date Noted   Injury of right ankle 11/21/2021   Dyspnea on exertion 08/29/2021   Family history of coronary artery disease 08/29/2021   Anxiety 08/29/2021   Bacterial vaginosis 08/24/2021   Nexplanon in place 04/21/2020   History of PID 04/15/2020   Recurrent vaginitis 04/15/2020   Possible exposure to STD 09/03/2019   Pilonidal sinus 11/28/2018   Allergic rhinitis 05/01/2012   Lower urinary tract infectious disease 05/01/2012    PCP: Terrilyn Saver, NP   REFERRING PROVIDER: Terrilyn Saver, NP   REFERRING DIAG: [id number] (ICD-10-CM) - Pain in both feet   THERAPY DIAG:  No diagnosis found.  Rationale for Evaluation and Treatment:  Rehabilitation  ONSET DATE: ***  SUBJECTIVE:   SUBJECTIVE STATEMENT: ***  PERTINENT HISTORY: *** PAIN:  Are you having pain? {OPRCPAIN:27236}  PRECAUTIONS: {Therapy precautions:24002}  WEIGHT BEARING RESTRICTIONS: {Yes ***/No:24003}  FALLS:  Has patient fallen in last 6 months? {fallsyesno:27318}  LIVING ENVIRONMENT: Lives with: {OPRC lives with:25569::"lives with their family"} Lives in: {Lives in:25570} Stairs: {opstairs:27293} Has following equipment at home: {Assistive devices:23999}  OCCUPATION: ***  PLOF: {PLOF:24004}  PATIENT GOALS: ***  NEXT MD VISIT:   OBJECTIVE:   DIAGNOSTIC FINDINGS:  Xray R ankle negative  PATIENT SURVEYS:  LEFS ***  COGNITION: Overall cognitive status: Within functional limits for tasks assessed     SENSATION: WFL  EDEMA:  Figure 8: ***  MUSCLE LENGTH: HS: Quads: ITB: Piriformis: Hip Flexors: Heelcords:   POSTURE: {posture:25561}  PALPATION: Palpation: TTP at ***. Increased tissue tension in ***  Ankle Mobility:     LOWER EXTREMITY ROM:   ROM Right eval Left eval  Hip flexion    Hip extension    Hip abduction    Hip adduction    Hip internal rotation    Hip external rotation    Knee flexion    Knee extension    Ankle dorsiflexion    Ankle plantarflexion    Ankle inversion    Ankle eversion     (Blank rows = not tested)  LOWER EXTREMITY MMT:  MMT Right eval Left eval  Hip flexion    Hip extension    Hip abduction    Hip adduction    Hip internal rotation  Hip external rotation    Knee flexion    Knee extension    Ankle dorsiflexion    Ankle plantarflexion    Ankle inversion    Ankle eversion     (Blank rows = not tested)  LOWER EXTREMITY SPECIAL TESTS:  {LEspecialtests:26242}  FUNCTIONAL TESTS:  {Functional tests:24029}  GAIT: Distance walked: *** Assistive device utilized: {Assistive devices:23999} Level of assistance: {Levels of assistance:24026} Comments:  ***   TODAY'S TREATMENT:                                                                                                                              DATE: ***  12/27/21  PATIENT EDUCATION:  Education details: PT eval findings, anticipated POC, initial HEP, and ***  Person educated: Patient Education method: Explanation, Demonstration, Verbal cues, and Handouts Education comprehension: verbalized understanding and returned demonstration  HOME EXERCISE PROGRAM: ***  ASSESSMENT:  CLINICAL IMPRESSION: Patient is a 26 y.o. female who was seen today for physical therapy evaluation and treatment for ***.   OBJECTIVE IMPAIRMENTS: {opptimpairments:25111}.   ACTIVITY LIMITATIONS: {activitylimitations:27494}  PARTICIPATION LIMITATIONS: {participationrestrictions:25113}  PERSONAL FACTORS: {Personal factors:25162} are also affecting patient's functional outcome.   REHAB POTENTIAL: {rehabpotential:25112}  CLINICAL DECISION MAKING: {clinical decision making:25114}  EVALUATION COMPLEXITY: {Evaluation complexity:25115}   GOALS: Goals reviewed with patient? Yes  SHORT TERM GOALS: Target date: {follow up:25551} (Remove Blue Hyperlink)  Patient will be independent with initial HEP. Baseline:  Goal status: INITIAL  2.  *** Baseline: *** Goal status: {GOALSTATUS:25110}  3.  *** Baseline: *** Goal status: {GOALSTATUS:25110}   LONG TERM GOALS: Target date: {follow up:25551} (Remove Blue Hyperlink)  Patient will be independent with advanced/ongoing HEP to improve outcomes and carryover.  Baseline:  Goal status: INITIAL  2.  Patient will report at least 75% improvement in *** ankle/foot pain to improve QOL. Baseline:  Goal status: INITIAL  3.  Patient will demonstrate improved *** ankle AROM to {Functional status:27472} to allow for normal gait and stair mechanics. Baseline:  Goal status: INITIAL  4.  Patient will demonstrate improved functional LE strength as demonstrated  by ***. Baseline:  Goal status: INITIAL  5.  Patient will be able to ambulate 600' with LRAD and normal gait pattern without increased foot/ankle pain to access community.  Baseline:  Goal status: INITIAL  6. Patient will be able to ascend/descend stairs with 1 HR and reciprocal step pattern safely to access home and community.  Baseline:  Goal status: INITIAL  7.  Patient will report *** on LEFS to demonstrate improved functional ability. Baseline: *** Goal status: INITIAL  8.  Patient will demonstrate at least 19/24 on DGI to decrease risk of falls. Baseline: *** Goal status: INITIAL   9.  *** Baseline: *** Goal status: {GOALSTATUS:25110}    PLAN:  PT FREQUENCY: {rehab frequency:25116}  PT DURATION: {rehab duration:25117}  PLANNED INTERVENTIONS: {rehab planned interventions:25118::"Therapeutic exercises","Therapeutic activity","Neuromuscular re-education","Balance training","Gait training","Patient/Family education","Self Care","Joint mobilization"}  PLAN FOR NEXT SESSION: ***  Awab Abebe, PT 12/26/2021, 9:56 PM

## 2021-12-27 ENCOUNTER — Ambulatory Visit: Payer: 59 | Attending: Family Medicine | Admitting: Physical Therapy

## 2022-02-03 DIAGNOSIS — N760 Acute vaginitis: Secondary | ICD-10-CM | POA: Diagnosis not present

## 2022-02-03 DIAGNOSIS — B3731 Acute candidiasis of vulva and vagina: Secondary | ICD-10-CM | POA: Diagnosis not present

## 2022-02-20 DIAGNOSIS — N761 Subacute and chronic vaginitis: Secondary | ICD-10-CM | POA: Diagnosis not present

## 2022-03-20 ENCOUNTER — Ambulatory Visit: Payer: Self-pay | Admitting: Family Medicine

## 2022-04-17 DIAGNOSIS — Z419 Encounter for procedure for purposes other than remedying health state, unspecified: Secondary | ICD-10-CM | POA: Diagnosis not present

## 2022-05-17 DIAGNOSIS — Z419 Encounter for procedure for purposes other than remedying health state, unspecified: Secondary | ICD-10-CM | POA: Diagnosis not present

## 2022-06-17 DIAGNOSIS — Z419 Encounter for procedure for purposes other than remedying health state, unspecified: Secondary | ICD-10-CM | POA: Diagnosis not present

## 2022-07-17 DIAGNOSIS — Z419 Encounter for procedure for purposes other than remedying health state, unspecified: Secondary | ICD-10-CM | POA: Diagnosis not present

## 2022-08-17 DIAGNOSIS — Z419 Encounter for procedure for purposes other than remedying health state, unspecified: Secondary | ICD-10-CM | POA: Diagnosis not present

## 2022-09-17 DIAGNOSIS — Z419 Encounter for procedure for purposes other than remedying health state, unspecified: Secondary | ICD-10-CM | POA: Diagnosis not present

## 2022-10-17 DIAGNOSIS — Z419 Encounter for procedure for purposes other than remedying health state, unspecified: Secondary | ICD-10-CM | POA: Diagnosis not present

## 2022-10-20 ENCOUNTER — Other Ambulatory Visit (HOSPITAL_COMMUNITY)
Admission: RE | Admit: 2022-10-20 | Discharge: 2022-10-20 | Disposition: A | Discharge status: Home / Self Care | Payer: Medicaid Other | Source: Ambulatory Visit | Attending: Obstetrics and Gynecology | Admitting: Obstetrics and Gynecology

## 2022-10-20 ENCOUNTER — Ambulatory Visit: Payer: Medicaid Other | Admitting: Obstetrics and Gynecology

## 2022-10-20 ENCOUNTER — Encounter: Payer: Self-pay | Admitting: Obstetrics and Gynecology

## 2022-10-20 VITALS — BP 106/75 | HR 82 | Resp 16 | Ht 67.0 in | Wt 203.0 lb

## 2022-10-20 DIAGNOSIS — B9689 Other specified bacterial agents as the cause of diseases classified elsewhere: Secondary | ICD-10-CM

## 2022-10-20 DIAGNOSIS — Z113 Encounter for screening for infections with a predominantly sexual mode of transmission: Secondary | ICD-10-CM

## 2022-10-20 DIAGNOSIS — N941 Unspecified dyspareunia: Secondary | ICD-10-CM | POA: Diagnosis not present

## 2022-10-20 DIAGNOSIS — N760 Acute vaginitis: Secondary | ICD-10-CM

## 2022-10-20 MED ORDER — METRONIDAZOLE 0.75 % VA GEL: 1.0000 | Freq: Every day | VAGINAL | 5 refills | Status: DC

## 2022-10-20 NOTE — Patient Instructions (Signed)
The test to see if your tubes are open is called a hysterosalpingogram

## 2022-10-20 NOTE — Progress Notes (Signed)
NEW GYNECOLOGY PATIENT Patient name: Nancy May MRN [id number]  Date of birth: 04-15-1995 Chief Complaint:   Nexplanon removal     History:  Nancy May is a 27 y.o. G1P0010 being seen today for recurrent BV, dyspareunia, fertility concerns, and abnormal bleeding with nexplanon.  Nexplanon placedin 2022. Had a nexplanon before and was amenorrheic and since having this nexplanon in place, has had irregular bleeding including up to a month of continuous bleeding. She also feels her already recurrent episodes of BV have been more frequent since the new nexplanon.   Hx of recurrent BV prior to nexplanon insertion. Typically symptoms improved with metrogel. Had a tube of gel and has been using it every 2-3 weeks when she feels she is having symptoms. States she has been on doxycycline for the treatment as well as a probiotic (limited use due to cost). She has never done prolonged suppressive treatment for BV. She has not changed her soap, detergent, etc. She has tried dove sensitive soap but didn't like it and switched to [place]. She is sexually active with a female partner.  Partner uses a different body wash/soap and she is not sure he cleans himself as well as he should including underneath his foreskin.     She is unsure if she is able to get pregnant. Has gotten pregnant before. She tried getting pregnant with a previous partner and was unsuccessful - that person does not have children. Current partner has recently fathered a pregnancy. She is not trying to conceive right now but worried about her ability to conceive. 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        Gynecologic History No LMP recorded. Patient has had an implant. 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 02/07/20 Retreated 3/24 based on similar presenting symptoms, however benign exam   Lower urinary tract infectious disease 05/01/2012   Nexplanon in place 04/21/2020   Formatting of this note might be different from the original. Nexplanon placed 04/21/2020   Pilonidal sinus 11/28/2018   Possible exposure to STD 09/03/2019   Recurrent vaginitis 04/15/2020   Formatting of this note might be different from the original. Multiple bouts of bacterial vaginosis and feels she cannot clear infection. Wet prep with clue cells, negative whiff 04/08/20 Treat empirically for BV and metrogel for suppression to follow (she was prescribed in the past but did not use for more than 2 months, will plan for 4 - 6 months maintenance of remission after initial treatment).    Past Surgical History:  Procedure Laterality Date   NO PAST SURGERIES      No current outpatient medications on file prior to visit.   No current facility-administered medications on file prior to visit.    No Known Allergies  Social History:  reports that she has never smoked. She has never  used smokeless tobacco. She reports current alcohol use of about 3.0 standard drinks of alcohol per week. She reports current drug use. Drug: Marijuana.  Family History  Problem Relation Age of Onset   Drug abuse Mother    Early death Mother    Heart failure Mother    Drug abuse Father    Heart disease Father    Stroke Maternal Grandmother    Hypertension Maternal Grandmother    Hyperlipidemia Maternal Grandmother    Arthritis Maternal Grandmother     The following portions of the patient's history were reviewed and updated as appropriate: allergies, current medications, past family history, past  medical history, past social history, past surgical history and problem list.  Review of Systems Pertinent items noted in HPI and remainder of comprehensive ROS otherwise negative.  Physical Exam:  BP 106/75   Pulse 82   Resp 16   Ht 5\' 7"  (1.702 m)   Wt 203 lb (92.1 kg)   BMI 31.79 kg/m  Physical Exam Vitals and nursing note reviewed. Exam conducted with a chaperone present.  Constitutional:      Appearance: Normal appearance.  Cardiovascular:     Rate and Rhythm: Normal rate.  Pulmonary:     Effort: Pulmonary effort is normal.     Breath sounds: Normal breath sounds.  Abdominal:     Comments: + carntett  Genitourinary:    General: Normal vulva.     Exam position: Lithotomy position.     Comments: Nontender pelvic floor muscles  Neurological:     General: No focal deficit present.     Mental Status: She is alert and oriented to person, place, and time.  Psychiatric:        Mood and Affect: Mood normal.        Behavior: Behavior normal.        Thought Content: Thought content normal.        Judgment: Judgment normal.        Assessment and Plan:   1. Acute vaginitis Vaginitis swab collected  2. Screening examination for STI - Cervicovaginal ancillary only( [place]) - HIV antibody (with reflex) - RPR - Hepatitis C Antibody - Hepatitis B Surface AntiGEN  3. BV (bacterial vaginosis) Discussed diagnosis and plan for prolonged suppressive treatment with metrogel.  - metroNIDAZOLE (METROGEL) 0.75 % vaginal gel; Place 1 Applicatorful vaginally at bedtime. Apply one applicatorful to vagina at bedtime for 10 days, then twice a week for 6 months.  Dispense: 70 g; Refill: 5  4. Dyspareunia, female Unclear etiology. No CMT on exam and nontender pelvic floor muscles. Possibly related to chronic vaginal infection, will follow up swabs.    Noted after visit no recent pap on file, can be completed on follow up during nexplanon exchange   Routine preventative health  maintenance measures emphasized. Please refer to After Visit Summary for other counseling recommendations.   Follow-up: No follow-ups on file.      Lorriane Shire, MD Obstetrician & Gynecologist, Faculty [facility] [employer], [facility]

## 2022-10-20 NOTE — Progress Notes (Signed)
Pt states she has chronic BV.  Usually has Metrodinazole gel on hand but out of it.  Pt  states she has painful intercourse.

## 2022-10-23 ENCOUNTER — Other Ambulatory Visit: Payer: Self-pay | Admitting: Obstetrics and Gynecology

## 2022-10-23 DIAGNOSIS — B3731 Acute candidiasis of vulva and vagina: Secondary | ICD-10-CM

## 2022-10-23 DIAGNOSIS — B9689 Other specified bacterial agents as the cause of diseases classified elsewhere: Secondary | ICD-10-CM

## 2022-10-23 LAB — CERVICOVAGINAL ANCILLARY ONLY
Bacterial Vaginitis (gardnerella): POSITIVE — AB
Candida Glabrata: NEGATIVE
Candida Vaginitis: POSITIVE — AB
Chlamydia: NEGATIVE
Comment: NEGATIVE
Comment: NEGATIVE
Comment: NEGATIVE
Comment: NEGATIVE
Comment: NEGATIVE
Comment: NORMAL
Neisseria Gonorrhea: NEGATIVE
Trichomonas: NEGATIVE

## 2022-10-23 MED ORDER — METRONIDAZOLE 500 MG PO TABS
500.0000 mg | ORAL_TABLET | Freq: Two times a day (BID) | ORAL | 0 refills | Status: DC
Start: 2022-10-23 — End: 2022-10-23

## 2022-10-23 MED ORDER — FLUCONAZOLE 150 MG PO TABS
150.0000 mg | ORAL_TABLET | Freq: Once | ORAL | 3 refills | Status: AC
Start: 2022-10-23 — End: 2022-10-23

## 2022-10-24 ENCOUNTER — Other Ambulatory Visit: Payer: Self-pay

## 2022-10-24 DIAGNOSIS — B379 Candidiasis, unspecified: Secondary | ICD-10-CM

## 2022-10-24 MED ORDER — FLUCONAZOLE 150 MG PO TABS
ORAL_TABLET | ORAL | 1 refills | Status: DC
Start: 2022-10-24 — End: 2023-01-29

## 2022-10-24 NOTE — Progress Notes (Signed)
Patient tested positive for a yeast infection. Diflucan 150 mg 1 tablet PO was sent to her pharmacy.  Nancy May Nancy May, CMA

## 2022-10-25 ENCOUNTER — Telehealth: Payer: Self-pay

## 2022-10-25 NOTE — Telephone Encounter (Signed)
Called patient to schedule a f/u appointment. Patient requests to see a different provider. Patient is scheduled to see Dr. Berton Lan on 11/24/22. Understanding was voiced. Lynita Groseclose l Seibert Keeter, CMA

## 2022-10-25 NOTE — Telephone Encounter (Signed)
-----   Message from Lorriane Shire sent at 10/25/2022 11:43 AM EDT ----- Regarding: RE: Pain Will have to discuss further ar follow up because pelvic exam benign during initial visit. ----- Message ----- From: Mikey Bussing, CMA Sent: 10/25/2022   9:38 AM EDT To: Lorriane Shire, MD Subject: Pain                                           This patient states that she is still having pain with intercourse.

## 2022-10-26 DIAGNOSIS — R051 Acute cough: Secondary | ICD-10-CM | POA: Diagnosis not present

## 2022-11-17 DIAGNOSIS — Z419 Encounter for procedure for purposes other than remedying health state, unspecified: Secondary | ICD-10-CM | POA: Diagnosis not present

## 2022-11-24 ENCOUNTER — Ambulatory Visit: Payer: Medicaid Other | Admitting: Obstetrics and Gynecology

## 2022-11-30 ENCOUNTER — Ambulatory Visit: Payer: Medicaid Other | Attending: Cardiology | Admitting: Cardiology

## 2022-12-17 DIAGNOSIS — Z419 Encounter for procedure for purposes other than remedying health state, unspecified: Secondary | ICD-10-CM | POA: Diagnosis not present

## 2023-01-17 DIAGNOSIS — Z419 Encounter for procedure for purposes other than remedying health state, unspecified: Secondary | ICD-10-CM | POA: Diagnosis not present

## 2023-01-26 ENCOUNTER — Ambulatory Visit: Payer: Self-pay | Admitting: Family Medicine

## 2023-01-26 NOTE — Telephone Encounter (Signed)
  Chief Complaint: Low back pain Symptoms: pain with activity Frequency: Began yesterday morning Pertinent Negatives: Patient denies loss of bowel or bladder, weakness or numbness, fever, urinary symptoms, injury Disposition: [] ED /[] Urgent Care (no appt availability in office) / [x] Appointment(In office/virtual)/ []  [place] [facility]/ [] Home Care/ [] Refused Recommended Disposition /[] [place] [facility]/ []  Follow-up with PCP Additional Notes: Patient calls stating that she woke up yesterday experiencing low back pain. She reports she had a fall in the past and was eval by PCP, and feels it may be related to that. Denies loss of bowel or bladder control, numbness or weakness. States pain is nonradiating and centralized. Per protocol, patient to be evaluated within 3 days. Patient requests appt with any provider after 1700 due to work schedule. Scheduled with alternate provider for 1740 in same office on 01/30/23. At end of call, patient also reports she has a ganglion cyst she would like PCP to look at for possible removal and she is requesting consult to physical therapy for back pain. Care advice reviewed, patient verbalized understanding. Denies further questions. Alerting PCP for review.   Copied from CRM [id number]. Topic: Clinical - Red Word Triage >> Jan 26, 2023  7:50 AM Deaijah H wrote: Red Word that prompted transfer to Nurse Triage: Ganglion cyst on Hand and severe back pain where she can't walk / infection on right side of mouth Reason for Disposition  [1] MODERATE back pain (e.g., interferes with normal activities) AND [2] present > 3 days  Answer Assessment - Initial Assessment Questions 1. ONSET: When did the pain begin?      Yesterday upon waking 2. LOCATION: Where does it hurt? (upper, mid or lower back)     Low back pain mid back 3. SEVERITY: How bad is the pain?  (e.g., Scale 1-10; mild, moderate, or severe)   - MILD (1-3): Doesn't interfere with normal activities.     - MODERATE (4-7): Interferes with normal activities or awakens from sleep.    - SEVERE (8-10): Excruciating pain, unable to do any normal activities.      8/10 4. PATTERN: Is the pain constant? (e.g., yes, no; constant, intermittent)      Intermittent, increases with movement 5. RADIATION: Does the pain shoot into your legs or somewhere else?     nonradiating 6. CAUSE:  What do you think is causing the back pain?      Fell down the stairs in the past, was evaluated by PCP post fall. 7. BACK OVERUSE:  Any recent lifting of heavy objects, strenuous work or exercise?     Started a new job, but it is less strenuous- working in [employer]- standing all day.  8. MEDICINES: What have you taken so far for the pain? (e.g., nothing, acetaminophen , NSAIDS)     Denies 9. NEUROLOGIC SYMPTOMS: Do you have any weakness, numbness, or problems with bowel/bladder control?     Denies 10. OTHER SYMPTOMS: Do you have any other symptoms? (e.g., fever, abdomen pain, burning with urination, blood in urine)       Denies 11. PREGNANCY: Is there any chance you are pregnant? When was your last menstrual period?       Denies- LMP: 2 weeks ago  Protocols used: Back Pain-A-AH

## 2023-01-28 ENCOUNTER — Emergency Department (HOSPITAL_BASED_OUTPATIENT_CLINIC_OR_DEPARTMENT_OTHER)
Admission: EM | Admit: 2023-01-28 | Discharge: 2023-01-28 | Disposition: A | Discharge status: Home / Self Care | Payer: Medicaid Other | Attending: Emergency Medicine | Admitting: Emergency Medicine

## 2023-01-28 ENCOUNTER — Encounter (HOSPITAL_BASED_OUTPATIENT_CLINIC_OR_DEPARTMENT_OTHER): Payer: Self-pay | Admitting: Emergency Medicine

## 2023-01-28 ENCOUNTER — Emergency Department (HOSPITAL_BASED_OUTPATIENT_CLINIC_OR_DEPARTMENT_OTHER): Payer: Medicaid Other

## 2023-01-28 ENCOUNTER — Other Ambulatory Visit: Payer: Self-pay

## 2023-01-28 DIAGNOSIS — M5459 Other low back pain: Secondary | ICD-10-CM | POA: Diagnosis not present

## 2023-01-28 DIAGNOSIS — R1031 Right lower quadrant pain: Secondary | ICD-10-CM | POA: Diagnosis not present

## 2023-01-28 DIAGNOSIS — R109 Unspecified abdominal pain: Secondary | ICD-10-CM | POA: Diagnosis not present

## 2023-01-28 DIAGNOSIS — N83291 Other ovarian cyst, right side: Secondary | ICD-10-CM | POA: Diagnosis not present

## 2023-01-28 DIAGNOSIS — D72829 Elevated white blood cell count, unspecified: Secondary | ICD-10-CM | POA: Insufficient documentation

## 2023-01-28 DIAGNOSIS — M47817 Spondylosis without myelopathy or radiculopathy, lumbosacral region: Secondary | ICD-10-CM | POA: Diagnosis not present

## 2023-01-28 DIAGNOSIS — M545 Low back pain, unspecified: Secondary | ICD-10-CM | POA: Diagnosis not present

## 2023-01-28 DIAGNOSIS — Z842 Family history of other diseases of the genitourinary system: Secondary | ICD-10-CM | POA: Diagnosis not present

## 2023-01-28 DIAGNOSIS — G8929 Other chronic pain: Secondary | ICD-10-CM | POA: Diagnosis not present

## 2023-01-28 DIAGNOSIS — N838 Other noninflammatory disorders of ovary, fallopian tube and broad ligament: Secondary | ICD-10-CM

## 2023-01-28 LAB — CBC
HCT: 40.5 % (ref 36.0–46.0)
Hemoglobin: 13.7 g/dL (ref 12.0–15.0)
MCH: 31.9 pg (ref 26.0–34.0)
MCHC: 33.8 g/dL (ref 30.0–36.0)
MCV: 94.2 fL (ref 80.0–100.0)
Platelets: 221 10*3/uL (ref 150–400)
RBC: 4.3 MIL/uL (ref 3.87–5.11)
RDW: 13.3 % (ref 11.5–15.5)
WBC: 10.9 10*3/uL — ABNORMAL HIGH (ref 4.0–10.5)
nRBC: 0 % (ref 0.0–0.2)

## 2023-01-28 LAB — COMPREHENSIVE METABOLIC PANEL
ALT: 16 U/L (ref 0–44)
AST: 18 U/L (ref 15–41)
Albumin: 3.8 g/dL (ref 3.5–5.0)
Alkaline Phosphatase: 65 U/L (ref 38–126)
Anion gap: 7 (ref 5–15)
BUN: 12 mg/dL (ref 6–20)
CO2: 25 mmol/L (ref 22–32)
Calcium: 8.9 mg/dL (ref 8.9–10.3)
Chloride: 105 mmol/L (ref 98–111)
Creatinine, Ser: 0.87 mg/dL (ref 0.44–1.00)
GFR, Estimated: >60 mL/min (ref >60)
Glucose, Bld: 90 mg/dL (ref 70–99)
Potassium: 3.7 mmol/L (ref 3.5–5.1)
Sodium: 137 mmol/L (ref 135–145)
Total Bilirubin: 0.5 mg/dL (ref 0.0–1.2)
Total Protein: 7.3 g/dL (ref 6.5–8.1)

## 2023-01-28 LAB — URINALYSIS, ROUTINE W REFLEX MICROSCOPIC
Bilirubin Urine: NEGATIVE
Glucose, UA: NEGATIVE mg/dL
Ketones, ur: NEGATIVE mg/dL
Leukocytes,Ua: NEGATIVE
Nitrite: NEGATIVE
Protein, ur: 30 mg/dL — AB
Specific Gravity, Urine: >=1.03 (ref 1.005–1.030)
pH: 6.5 (ref 5.0–8.0)

## 2023-01-28 LAB — URINALYSIS, MICROSCOPIC (REFLEX)

## 2023-01-28 LAB — PREGNANCY, URINE: Preg Test, Ur: NEGATIVE

## 2023-01-28 MED ORDER — KETOROLAC TROMETHAMINE 10 MG PO TABS: 10.0000 mg | ORAL_TABLET | Freq: Four times a day (QID) | ORAL | 0 refills | Status: DC | PRN

## 2023-01-28 MED ORDER — KETOROLAC TROMETHAMINE 15 MG/ML IJ SOLN
15.0000 mg | Freq: Once | INTRAMUSCULAR | Status: AC
  Administered 2023-01-28: 15 mg via INTRAVENOUS
  Filled 2023-01-28: qty 1

## 2023-01-28 NOTE — ED Notes (Signed)
Patient transported to CT via WC

## 2023-01-28 NOTE — ED Provider Notes (Signed)
 [place] [facility] Provider Note   CSN: [id number] Arrival date & time: 01/28/23  1033     History  Chief Complaint  Patient presents with   Back Pain    Nancy May is a 28 y.o. female.   Back Pain   28 year old female presents emergency department with complaints of back pain.  Patient reports back pain worsened for the past few days.  States she has had 2 falls in her apartment complex with most recent fall being 3 to 4 weeks ago.  Was placed on meloxicam  by her primary care which has helped from traumatic injuries.  States that she woke up 2 to 3 days ago with acutely worsened low back pain.  States that she noticed pain radiate to right groin area.  Denies any fever, chills, nausea, vomiting, urinary symptoms vaginal symptoms, change in bowel habits.  Denies any saddle anesthesia, bowel/bladder dysfunction, weakness/sensory deficits lower extremities, history of IV drug use, prolonged corticosteroid use, known malignancy  Past medical history significant for PID, UTI, allergic rhinitis, BV  Home Medications Prior to Admission medications   Medication Sig Start Date End Date Taking? Authorizing Provider  ketorolac  (TORADOL ) 10 MG tablet Take 1 tablet (10 mg total) by mouth every 6 (six) hours as needed. 01/28/23  Yes Silver Fell A, PA  fluconazole  (DIFLUCAN ) 150 MG tablet Take 1 tablet by mouth. Repeat in 3 days if symptoms persists. Patient taking differently: Take 150 mg by mouth once. Take 1 tablet by mouth. Repeat in 3 days if symptoms persists. 10/24/22   Synthia Raisin, CNM  metroNIDAZOLE  (METROGEL ) 0.75 % vaginal gel Place 1 Applicatorful vaginally at bedtime. Apply one applicatorful to vagina at bedtime for 10 days, then twice a week for 6 months. 10/20/22   Ajewole, Christana, MD      Allergies    Patient has no known allergies.    Review of Systems   Review of Systems  Musculoskeletal:  Positive for back pain.  All  other systems reviewed and are negative.   Physical Exam Updated Vital Signs BP 119/82   Pulse 66   Temp 98.1 F (36.7 C) (Oral)   Resp 18   Ht 5' 7 (1.702 m)   Wt 95.3 kg   LMP 12/31/2022   SpO2 99%   BMI 32.89 kg/m  Physical Exam Vitals and nursing note reviewed.  Constitutional:      General: She is not in acute distress.    Appearance: She is well-developed.  HENT:     Head: Normocephalic and atraumatic.  Eyes:     Conjunctiva/sclera: Conjunctivae normal.  Cardiovascular:     Rate and Rhythm: Normal rate and regular rhythm.     Heart sounds: No murmur heard. Pulmonary:     Effort: Pulmonary effort is normal. No respiratory distress.     Breath sounds: Normal breath sounds.  Abdominal:     Palpations: Abdomen is soft.     Tenderness: There is no abdominal tenderness.     Comments: Right greater than left CVA tenderness versus paraspinal tenderness noted lumbar region.  Musculoskeletal:        General: No swelling.     Cervical back: Neck supple.     Comments: Full range of motion bilateral lower extremities.  Muscular strength 5 out of 5.  No sensory deficits on major nerve distributions of lower extremities.  DTR's symmetric at patella.  Pedal pulses 2+ bilaterally.  Skin:    General: Skin is  warm and dry.     Capillary Refill: Capillary refill takes less than 2 seconds.  Neurological:     Mental Status: She is alert.  Psychiatric:        Mood and Affect: Mood normal.     ED Results / Procedures / Treatments   Labs (all labs ordered are listed, but only abnormal results are displayed) Labs Reviewed  URINALYSIS, ROUTINE W REFLEX MICROSCOPIC - Abnormal; Notable for the following components:      Result Value   APPearance CLOUDY (*)    Hgb urine dipstick TRACE (*)    Protein, ur 30 (*)    All other components within normal limits  CBC - Abnormal; Notable for the following components:   WBC 10.9 (*)    All other components within normal limits   URINALYSIS, MICROSCOPIC (REFLEX) - Abnormal; Notable for the following components:   Bacteria, UA MANY (*)    All other components within normal limits  PREGNANCY, URINE  COMPREHENSIVE METABOLIC PANEL    EKG None  Radiology US  Pelvis Complete Result Date: 01/28/2023 CLINICAL DATA:  Chronic right lower quadrant pain for the past year. EXAM: TRANSABDOMINAL AND TRANSVAGINAL ULTRASOUND OF PELVIS DOPPLER ULTRASOUND OF OVARIES TECHNIQUE: Both transabdominal and transvaginal ultrasound examinations of the pelvis were performed. Transabdominal technique was performed for global imaging of the pelvis including uterus, ovaries, adnexal regions, and pelvic cul-de-sac. It was necessary to proceed with endovaginal exam following the transabdominal exam to visualize the uterus, endometrium, ovaries, and adnexa. Color and duplex Doppler ultrasound was utilized to evaluate blood flow to the ovaries. COMPARISON:  CT abdomen pelvis from same day. Pelvic ultrasound dated April 29, 2019. FINDINGS: Uterus Measurements: 7.1 x 4.0 x 5.1 cm = volume: 75 mL. Retroverted. No fibroids or other mass visualized. Endometrium Thickness: 4 mm.  No focal abnormality visualized. Right ovary Measurements: 5.4 x 2.8 x 2.8 cm = volume: 22.5 mL. Normal appearance/no adnexal mass. 2.6 x 2.1 x 2.3 cm dominant follicle with eccentric smaller cyst within the largest cyst (daughter cyst sign). Left ovary Measurements: 3.0 x 2.0 x 1.8 cm = volume: 5.6 mL. Normal appearance/no adnexal mass. Pulsed Doppler evaluation of both ovaries demonstrates normal low-resistance arterial and venous waveforms. Other findings No abnormal free fluid. IMPRESSION: 1. No acute abnormality. No evidence of ovarian torsion. 2. 2.6 cm right ovarian dominant follicle corresponds to the CT finding. No follow-up imaging is recommended. Electronically Signed   By: Elsie ONEIDA Shoulder M.D.   On: 01/28/2023 13:43   US  Transvaginal Non-OB Result Date: 01/28/2023 CLINICAL DATA:   Chronic right lower quadrant pain for the past year. EXAM: TRANSABDOMINAL AND TRANSVAGINAL ULTRASOUND OF PELVIS DOPPLER ULTRASOUND OF OVARIES TECHNIQUE: Both transabdominal and transvaginal ultrasound examinations of the pelvis were performed. Transabdominal technique was performed for global imaging of the pelvis including uterus, ovaries, adnexal regions, and pelvic cul-de-sac. It was necessary to proceed with endovaginal exam following the transabdominal exam to visualize the uterus, endometrium, ovaries, and adnexa. Color and duplex Doppler ultrasound was utilized to evaluate blood flow to the ovaries. COMPARISON:  CT abdomen pelvis from same day. Pelvic ultrasound dated April 29, 2019. FINDINGS: Uterus Measurements: 7.1 x 4.0 x 5.1 cm = volume: 75 mL. Retroverted. No fibroids or other mass visualized. Endometrium Thickness: 4 mm.  No focal abnormality visualized. Right ovary Measurements: 5.4 x 2.8 x 2.8 cm = volume: 22.5 mL. Normal appearance/no adnexal mass. 2.6 x 2.1 x 2.3 cm dominant follicle with eccentric smaller cyst within the largest  cyst (daughter cyst sign). Left ovary Measurements: 3.0 x 2.0 x 1.8 cm = volume: 5.6 mL. Normal appearance/no adnexal mass. Pulsed Doppler evaluation of both ovaries demonstrates normal low-resistance arterial and venous waveforms. Other findings No abnormal free fluid. IMPRESSION: 1. No acute abnormality. No evidence of ovarian torsion. 2. 2.6 cm right ovarian dominant follicle corresponds to the CT finding. No follow-up imaging is recommended. Electronically Signed   By: Elsie ONEIDA Shoulder M.D.   On: 01/28/2023 13:43   US  Art/Ven Flow Abd Pelv Doppler Result Date: 01/28/2023 CLINICAL DATA:  Chronic right lower quadrant pain for the past year. EXAM: TRANSABDOMINAL AND TRANSVAGINAL ULTRASOUND OF PELVIS DOPPLER ULTRASOUND OF OVARIES TECHNIQUE: Both transabdominal and transvaginal ultrasound examinations of the pelvis were performed. Transabdominal technique was performed  for global imaging of the pelvis including uterus, ovaries, adnexal regions, and pelvic cul-de-sac. It was necessary to proceed with endovaginal exam following the transabdominal exam to visualize the uterus, endometrium, ovaries, and adnexa. Color and duplex Doppler ultrasound was utilized to evaluate blood flow to the ovaries. COMPARISON:  CT abdomen pelvis from same day. Pelvic ultrasound dated April 29, 2019. FINDINGS: Uterus Measurements: 7.1 x 4.0 x 5.1 cm = volume: 75 mL. Retroverted. No fibroids or other mass visualized. Endometrium Thickness: 4 mm.  No focal abnormality visualized. Right ovary Measurements: 5.4 x 2.8 x 2.8 cm = volume: 22.5 mL. Normal appearance/no adnexal mass. 2.6 x 2.1 x 2.3 cm dominant follicle with eccentric smaller cyst within the largest cyst (daughter cyst sign). Left ovary Measurements: 3.0 x 2.0 x 1.8 cm = volume: 5.6 mL. Normal appearance/no adnexal mass. Pulsed Doppler evaluation of both ovaries demonstrates normal low-resistance arterial and venous waveforms. Other findings No abnormal free fluid. IMPRESSION: 1. No acute abnormality. No evidence of ovarian torsion. 2. 2.6 cm right ovarian dominant follicle corresponds to the CT finding. No follow-up imaging is recommended. Electronically Signed   By: Elsie ONEIDA Shoulder M.D.   On: 01/28/2023 13:43   CT L-SPINE NO CHARGE Result Date: 01/28/2023 CLINICAL DATA:  Low back pain.  Fall in twice in the past 2 months. EXAM: CT LUMBAR SPINE WITHOUT CONTRAST TECHNIQUE: Multidetector CT imaging of the lumbar spine was performed without intravenous contrast administration. Multiplanar CT image reconstructions were also generated. RADIATION DOSE REDUCTION: This exam was performed according to the departmental dose-optimization program which includes automated exposure control, adjustment of the mA and/or kV according to patient size and/or use of iterative reconstruction technique. COMPARISON:  CT abdomen pelvis dated April 29, 2019.  FINDINGS: Segmentation: Based on prior imaging, there are 12 rib bearing thoracic vertebral bodies. 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 The last well-formed disc space is designated S1-S2. Alignment: Normal. Vertebrae: No acute fracture or focal pathologic process. Paraspinal and other soft tissues: Negative. Disc levels: No significant disc bulge or herniation. No stenosis. Mild bilateral facet arthropathy at L5-S1. IMPRESSION: 1. No acute osseous abnormality. Electronically Signed   By: Elsie ONEIDA Shoulder M.D.   On: 01/28/2023 12:38   CT Renal Stone Study Result Date: 01/28/2023 CLINICAL DATA:  Abdominal and flank pain.  Suspected stone. EXAM: CT ABDOMEN AND PELVIS WITHOUT CONTRAST TECHNIQUE: Multidetector CT imaging of the abdomen and pelvis was performed following the standard protocol without IV contrast. RADIATION DOSE REDUCTION: This exam was performed according to the departmental dose-optimization program which includes automated exposure control, adjustment of the mA and/or kV according to patient size and/or use of  iterative reconstruction technique. COMPARISON:  04/29/2019 FINDINGS: Lower chest: No acute findings. Hepatobiliary: No mass visualized on this unenhanced exam. Gallbladder is unremarkable. No evidence of biliary ductal dilatation. Pancreas: No mass or inflammatory process visualized on this unenhanced exam. Spleen:  Within normal limits in size. Adrenals/Urinary tract: No evidence of urolithiasis or hydronephrosis. Unremarkable unopacified urinary bladder. Stomach/Bowel: No evidence of obstruction, inflammatory process, or abnormal fluid collections. Vascular/Lymphatic: No pathologically enlarged lymph nodes identified. No evidence of abdominal aortic aneurysm. Reproductive: A heterogeneous low attenuation lesion is seen in the right adnexa measuring 3.3 cm. This cannot be characterized  on this unenhanced exam. No inflammatory process or free fluid identified. Other:  None. Musculoskeletal:  No suspicious bone lesions identified. IMPRESSION: No evidence of urolithiasis or hydronephrosis. 3.3 cm heterogeneous low-attenuation right adnexal lesion, which cannot be characterized on this unenhanced exam. Because this lesion is not adequately characterized, US  is recommended for further evaluation. Reference: JACR 2020 Feb; 17(2):248-254 Electronically Signed   By: Norleen DELENA Kil M.D.   On: 01/28/2023 12:36    Procedures Procedures    Medications Ordered in ED Medications  ketorolac  (TORADOL ) 15 MG/ML injection 15 mg (15 mg Intravenous Given 01/28/23 1211)    ED Course/ Medical Decision Making/ A&P                                 Medical Decision Making Amount and/or Complexity of Data Reviewed Labs: ordered. Radiology: ordered.  Risk Prescription drug management.   This patient presents to the ED for concern of back pain, this involves an extensive number of treatment options, and is a complaint that carries with it a high risk of complications and morbidity.  The differential diagnosis includes fracture, strain/sprain, dislocation, ligamentous/tendinous injury, neurovascular compromise, cauda equina, spinal epidural abscess, pyelonephritis, nephrolithiasis, aortic dissection, other   Co morbidities that complicate the patient evaluation  See HPI   Additional history obtained:  Additional history obtained from EMR External records from outside source obtained and reviewed including possible records   Lab Tests:  I Ordered, and personally interpreted labs.  The pertinent results include: Urine pregnancy negative.  UA contaminated sample with 21-50 squamous epithelial cells, many bacteria, 6-10 RBCs, 30 proteins, trace hemoglobin.  Slight leukocytosis at 10.9.  No evidence of anemia.  Platelets within range.  No Electra abnormalities.  No transaminitis.  No renal  dysfunction.   Imaging Studies ordered:  I ordered imaging studies including CT renal stone/CT lumbar spine/pelvic ultrasound I independently visualized and interpreted imaging which showed  CT renal stone study/lumbar spine: No urolithiasis or hydronephrosis.  3.3 heterogeneous low-attenuation right adnexal lesion.  No acute fracture or traumatic listhesis of lumbar spine. Pelvic ultrasound: No acute abnormality.  No evidence of torsion.  2.6 right-sided dominant follicular I agree with the radiologist interpretation  Cardiac Monitoring: / EKG:  The patient was maintained on a cardiac monitor.  I personally viewed and interpreted the cardiac monitored which showed an underlying rhythm of: Sinus rhythm   Consultations Obtained:  N/a   Problem List / ED Course / Critical interventions / Medication management  Back pain I ordered medication including Toradol    Reevaluation of the patient after these medicines showed that the patient improved I have reviewed the patients home medicines and have made adjustments as needed   Social Determinants of Health:  Denies tobacco, illicit drug use   Test / Admission - Considered:  Back pain Vitals signs  within normal range and stable throughout visit. Laboratory/imaging studies significant for: See above 28 year old female presents emergency department with some back pain with some radiation to the inguinal region.  Had mechanical fall 3 to 4 weeks ago and has been dealing with back pain intermittently since then.  Worsened 3 days ago with acutely worsened back pain.  On exam, paraspinal tenderness versus CVA tenderness lumbar spine.  No obvious red flag signs or back pain on HPI/PE; low suspicion for spinal cord compression/impingement.  Labs concerning for slight leukocytosis of 10.9.  UA with contaminated sample with pretty significant amount of squamous epithelial cells but with RBCs, bacteria but negative nitrite, leukocyte, WBCs.  CT  renal stone/lumbar study concerning for right-sided ovarian lesion but otherwise unremarkable.  Repeat evaluation did show some tenderness of right lower abdomen.  Upon further review, patient states that she has been trying to follow-up with OB/GYN for further workup regarding lower abdominal pain that is been present for the past several weeks but has not been able to get an appointment.  Subsequent pelvic ultrasound findings as above without evidence of torsion with most likely right-sided dominant ovarian follicle.  Suspect patient symptoms of low back pain more secondary to muscular etiology.  Did note significant improvement with Toradol  while in the ED.  Recommend continued use with symptoms outpatient setting for treatment of back pain and follow-up with primary care.  Will recommend follow-up with OB/GYN regarding right-sided dominant ovarian follicle.  Treatment plan discussed at length with patient and she acknowledged understanding was agreeable to said plan.  Patient overall well-appearing, afebrile in no acute distress. Worrisome signs and symptoms were discussed with the patient, and the patient acknowledged understanding to return to the ED if noticed. Patient was stable upon discharge.          Final Clinical Impression(s) / ED Diagnoses Final diagnoses:  Acute low back pain without sciatica, unspecified back pain laterality  Ovarian mass, right    Rx / DC Orders ED Discharge Orders          Ordered    ketorolac  (TORADOL ) 10 MG tablet  Every 6 hours PRN        01/28/23 1403              Silver Wonda LABOR, PA 01/28/23 1834    Elnor Jayson LABOR, DO 02/02/23 1519

## 2023-01-28 NOTE — Discharge Instructions (Signed)
 As discussed, workup today overall reassuring.  Imaging studies negative for kidney stone, broken bone or dislocation.  Ultrasound did show evidence of dominant right sided ovarian follicle with no obvious cyst or mass.  Will recommend following up with your OB/GYN in the outpatient setting for reassessment.  Will send you home with the same medicine we gave you on the emergency department called Toradol .  It is in the same family as ibuprofen Jeronimo so please not take these medicines at the same time.  Please do not hesitate to return to emergency department if the worrisome signs and symptoms we discussed become apparent.

## 2023-01-28 NOTE — ED Triage Notes (Signed)
 States has fallen x2 in the past month due to slippery step at apartment. Lower back , worse with movement

## 2023-01-28 NOTE — ED Notes (Signed)
 Patient transported to [place] via [facility].

## 2023-01-29 ENCOUNTER — Ambulatory Visit: Payer: Medicaid Other | Admitting: Family Medicine

## 2023-01-29 ENCOUNTER — Ambulatory Visit (INDEPENDENT_AMBULATORY_CARE_PROVIDER_SITE_OTHER): Payer: Medicaid Other | Admitting: Family Medicine

## 2023-01-29 ENCOUNTER — Encounter: Payer: Self-pay | Admitting: Family Medicine

## 2023-01-29 VITALS — BP 126/78 | HR 88 | Temp 98.0°F | Resp 16 | Ht 67.0 in | Wt 204.6 lb

## 2023-01-29 DIAGNOSIS — M6740 Ganglion, unspecified site: Secondary | ICD-10-CM | POA: Diagnosis not present

## 2023-01-29 DIAGNOSIS — M545 Low back pain, unspecified: Secondary | ICD-10-CM

## 2023-01-29 MED ORDER — TIZANIDINE HCL 4 MG PO TABS: 4.0000 mg | ORAL_TABLET | Freq: Four times a day (QID) | ORAL | 0 refills | Status: DC | PRN

## 2023-01-29 MED ORDER — MELOXICAM 15 MG PO TABS: 15.0000 mg | ORAL_TABLET | Freq: Every day | ORAL | 0 refills | Status: DC

## 2023-01-29 NOTE — Patient Instructions (Signed)
 If you do not hear anything about your referral in the next 1-2 weeks, call our office and ask for an update.  Ice/cold pack over area for 10-15 min twice daily.  OK to take Tylenol  1000 mg (2 extra strength tabs) or 975 mg (3 regular strength tabs) every 6 hours as needed.  Heat (pad or rice pillow in microwave) over affected area, 10-15 minutes twice daily.   Let us  know if you need anything.  Take Zanaflex  (tizanidine ) 1-2 hours before planned bedtime. If it makes you drowsy, do not take during the day. You can try half a tab the following night.  EXERCISES  RANGE OF MOTION (ROM) AND STRETCHING EXERCISES - Low Back Pain Most people with lower back pain will find that their symptoms get worse with excessive bending forward (flexion) or arching at the lower back (extension). The exercises that will help resolve your symptoms will focus on the opposite motion.  If you have pain, numbness or tingling which travels down into your buttocks, leg or foot, the goal of the therapy is for these symptoms to move closer to your back and eventually resolve. Sometimes, these leg symptoms will get better, but your lower back pain may worsen. This is often an indication of progress in your rehabilitation. Be very alert to any changes in your symptoms and the activities in which you participated in the 24 hours prior to the change. Sharing this information with your caregiver will allow him or her to most efficiently treat your condition. These exercises may help you when beginning to rehabilitate your injury. Your symptoms may resolve with or without further involvement from your physician, physical therapist or athletic trainer. While completing these exercises, remember:  Restoring tissue flexibility helps normal motion to return to the joints. This allows healthier, less painful movement and activity. An effective stretch should be held for at least 30 seconds. A stretch should never be painful. You should  only feel a gentle lengthening or release in the stretched tissue. FLEXION RANGE OF MOTION AND STRETCHING EXERCISES:  STRETCH - Flexion, Single Knee to Chest  Lie on a firm bed or floor with both legs extended in front of you. Keeping one leg in contact with the floor, bring your opposite knee to your chest. Hold your leg in place by either grabbing behind your thigh or at your knee. Pull until you feel a gentle stretch in your low back. Hold 30 seconds. Slowly release your grasp and repeat the exercise with the opposite side. Repeat 2 times. Complete this exercise 3 times per week.   STRETCH - Flexion, Double Knee to Chest Lie on a firm bed or floor with both legs extended in front of you. Keeping one leg in contact with the floor, bring your opposite knee to your chest. Tense your stomach muscles to support your back and then lift your other knee to your chest. Hold your legs in place by either grabbing behind your thighs or at your knees. Pull both knees toward your chest until you feel a gentle stretch in your low back. Hold 30 seconds. Tense your stomach muscles and slowly return one leg at a time to the floor. Repeat 2 times. Complete this exercise 3 times per week.   STRETCH - Low Trunk Rotation Lie on a firm bed or floor. Keeping your legs in front of you, bend your knees so they are both pointed toward the ceiling and your feet are flat on the floor. Extend your arms  out to the side. This will stabilize your upper body by keeping your shoulders in contact with the floor. Gently and slowly drop both knees together to one side until you feel a gentle stretch in your low back. Hold for 30 seconds. Tense your stomach muscles to support your lower back as you bring your knees back to the starting position. Repeat the exercise to the other side. Repeat 2 times. Complete this exercise at least 3 times per week.   EXTENSION RANGE OF MOTION AND FLEXIBILITY EXERCISES:  STRETCH - Extension,  Prone on Elbows  Lie on your stomach on the floor, a bed will be too soft. Place your palms about shoulder width apart and at the height of your head. Place your elbows under your shoulders. If this is too painful, stack pillows under your chest. Allow your body to relax so that your hips drop lower and make contact more completely with the floor. Hold this position for 30 seconds. Slowly return to lying flat on the floor. Repeat 2 times. Complete this exercise 3 times per week.   RANGE OF MOTION - Extension, Prone Press Ups Lie on your stomach on the floor, a bed will be too soft. Place your palms about shoulder width apart and at the height of your head. Keeping your back as relaxed as possible, slowly straighten your elbows while keeping your hips on the floor. You may adjust the placement of your hands to maximize your comfort. As you gain motion, your hands will come more underneath your shoulders. Hold this position 30 seconds. Slowly return to lying flat on the floor. Repeat 2 times. Complete this exercise 3 times per week.   RANGE OF MOTION- Quadruped, Neutral Spine  Assume a hands and knees position on a firm surface. Keep your hands under your shoulders and your knees under your hips. You may place padding under your knees for comfort. Drop your head and point your tailbone toward the ground below you. This will round out your lower back like an angry cat. Hold this position for 30 seconds. Slowly lift your head and release your tail bone so that your back sags into a large arch, like an old horse. Hold this position for 30 seconds. Repeat this until you feel limber in your low back. Now, find your sweet spot. This will be the most comfortable position somewhere between the two previous positions. This is your neutral spine. Once you have found this position, tense your stomach muscles to support your low back. Hold this position for 30 seconds. Repeat 2 times. Complete this  exercise 3 times per week.   STRENGTHENING EXERCISES - Low Back Sprain These exercises may help you when beginning to rehabilitate your injury. These exercises should be done near your sweet spot. This is the neutral, low-back arch, somewhere between fully rounded and fully arched, that is your least painful position. When performed in this safe range of motion, these exercises can be used for people who have either a flexion or extension based injury. These exercises may resolve your symptoms with or without further involvement from your physician, physical therapist or athletic trainer. While completing these exercises, remember:  Muscles can gain both the endurance and the strength needed for everyday activities through controlled exercises. Complete these exercises as instructed by your physician, physical therapist or athletic trainer. Increase the resistance and repetitions only as guided. You may experience muscle soreness or fatigue, but the pain or discomfort you are trying to eliminate should  never worsen during these exercises. If this pain does worsen, stop and make certain you are following the directions exactly. If the pain is still present after adjustments, discontinue the exercise until you can discuss the trouble with your caregiver.  STRENGTHENING - Deep Abdominals, Pelvic Tilt  Lie on a firm bed or floor. Keeping your legs in front of you, bend your knees so they are both pointed toward the ceiling and your feet are flat on the floor. Tense your lower abdominal muscles to press your low back into the floor. This motion will rotate your pelvis so that your tail bone is scooping upwards rather than pointing at your feet or into the floor. With a gentle tension and even breathing, hold this position for 3 seconds. Repeat 2 times. Complete this exercise 3 times per week.   STRENGTHENING - Abdominals, Crunches  Lie on a firm bed or floor. Keeping your legs in front of you, bend your  knees so they are both pointed toward the ceiling and your feet are flat on the floor. Cross your arms over your chest. Slightly tip your chin down without bending your neck. Tense your abdominals and slowly lift your trunk high enough to just clear your shoulder blades. Lifting higher can put excessive stress on the lower back and does not further strengthen your abdominal muscles. Control your return to the starting position. Repeat 2 times. Complete this exercise 3 times per week.   STRENGTHENING - Quadruped, Opposite UE/LE Lift  Assume a hands and knees position on a firm surface. Keep your hands under your shoulders and your knees under your hips. You may place padding under your knees for comfort. Find your neutral spine and gently tense your abdominal muscles so that you can maintain this position. Your shoulders and hips should form a rectangle that is parallel with the floor and is not twisted. Keeping your trunk steady, lift your right hand no higher than your shoulder and then your left leg no higher than your hip. Make sure you are not holding your breath. Hold this position for 30 seconds. Continuing to keep your abdominal muscles tense and your back steady, slowly return to your starting position. Repeat with the opposite arm and leg. Repeat 2 times. Complete this exercise 3 times per week.   STRENGTHENING - Abdominals and Quadriceps, Straight Leg Raise  Lie on a firm bed or floor with both legs extended in front of you. Keeping one leg in contact with the floor, bend the other knee so that your foot can rest flat on the floor. Find your neutral spine, and tense your abdominal muscles to maintain your spinal position throughout the exercise. Slowly lift your straight leg off the floor about 6 inches for a count of 3, making sure to not hold your breath. Still keeping your neutral spine, slowly lower your leg all the way to the floor. Repeat this exercise with each leg 2 times.  Complete this exercise 3 times per week.  POSTURE AND BODY MECHANICS CONSIDERATIONS - Low Back Sprain Keeping correct posture when sitting, standing or completing your activities will reduce the stress put on different body tissues, allowing injured tissues a chance to heal and limiting painful experiences. The following are general guidelines for improved posture.  While reading these guidelines, remember: The exercises prescribed by your provider will help you have the flexibility and strength to maintain correct postures. The correct posture provides the best environment for your joints to work. All of your  joints have less wear and tear when properly supported by a spine with good posture. This means you will experience a healthier, less painful body. Correct posture must be practiced with all of your activities, especially prolonged sitting and standing. Correct posture is as important when doing repetitive low-stress activities (typing) as it is when doing a single heavy-load activity (lifting).  RESTING POSITIONS Consider which positions are most painful for you when choosing a resting position. If you have pain with flexion-based activities (sitting, bending, stooping, squatting), choose a position that allows you to rest in a less flexed posture. You would want to avoid curling into a fetal position on your side. If your pain worsens with extension-based activities (prolonged standing, working overhead), avoid resting in an extended position such as sleeping on your stomach. Most people will find more comfort when they rest with their spine in a more neutral position, neither too rounded nor too arched. Lying on a non-sagging bed on your side with a pillow between your knees, or on your back with a pillow under your knees will often provide some relief. Keep in mind, being in any one position for a prolonged period of time, no matter how correct your posture, can still lead to stiffness.  PROPER  SITTING POSTURE In order to minimize stress and discomfort on your spine, you must sit with correct posture. Sitting with good posture should be effortless for a healthy body. Returning to good posture is a gradual process. Many people can work toward this most comfortably by using various supports until they have the flexibility and strength to maintain this posture on their own. When sitting with proper posture, your ears will fall over your shoulders and your shoulders will fall over your hips. You should use the back of the chair to support your upper back. Your lower back will be in a neutral position, just slightly arched. You may place a small pillow or folded towel at the base of your lower back for  support.  When working at a desk, create an environment that supports good, upright posture. Without extra support, muscles tire, which leads to excessive strain on joints and other tissues. Keep these recommendations in mind:  CHAIR: A chair should be able to slide under your desk when your back makes contact with the back of the chair. This allows you to work closely. The chair's height should allow your eyes to be level with the upper part of your monitor and your hands to be slightly lower than your elbows.  BODY POSITION Your feet should make contact with the floor. If this is not possible, use a foot rest. Keep your ears over your shoulders. This will reduce stress on your neck and low back.  INCORRECT SITTING POSTURES  If you are feeling tired and unable to assume a healthy sitting posture, do not slouch or slump. This puts excessive strain on your back tissues, causing more damage and pain. Healthier options include: Using more support, like a lumbar pillow. Switching tasks to something that requires you to be upright or walking. Talking a brief walk. Lying down to rest in a neutral-spine position.  PROLONGED STANDING WHILE SLIGHTLY LEANING FORWARD  When completing a task that  requires you to lean forward while standing in one place for a long time, place either foot up on a stationary 2-4 inch high object to help maintain the best posture. When both feet are on the ground, the lower back tends to lose its slight  inward curve. If this curve flattens (or becomes too large), then the back and your other joints will experience too much stress, tire more quickly, and can cause pain.  CORRECT STANDING POSTURES Proper standing posture should be assumed with all daily activities, even if they only take a few moments, like when brushing your teeth. As in sitting, your ears should fall over your shoulders and your shoulders should fall over your hips. You should keep a slight tension in your abdominal muscles to brace your spine. Your tailbone should point down to the ground, not behind your body, resulting in an over-extended swayback posture.   INCORRECT STANDING POSTURES  Common incorrect standing postures include a forward head, locked knees and/or an excessive swayback. WALKING Walk with an upright posture. Your ears, shoulders and hips should all line-up.  PROLONGED ACTIVITY IN A FLEXED POSITION When completing a task that requires you to bend forward at your waist or lean over a low surface, try to find a way to stabilize 3 out of 4 of your limbs. You can place a hand or elbow on your thigh or rest a knee on the surface you are reaching across. This will provide you more stability, so that your muscles do not tire as quickly. By keeping your knees relaxed, or slightly bent, you will also reduce stress across your lower back. CORRECT LIFTING TECHNIQUES  DO : Assume a wide stance. This will provide you more stability and the opportunity to get as close as possible to the object which you are lifting. Tense your abdominals to brace your spine. Bend at the knees and hips. Keeping your back locked in a neutral-spine position, lift using your leg muscles. Lift with your legs,  keeping your back straight. Test the weight of unknown objects before attempting to lift them. Try to keep your elbows locked down at your sides in order get the best strength from your shoulders when carrying an object.   Always ask for help when lifting heavy or awkward objects. INCORRECT LIFTING TECHNIQUES DO NOT:  Lock your knees when lifting, even if it is a small object. Bend and twist. Pivot at your feet or move your feet when needing to change directions. Assume that you can safely pick up even a paperclip without proper posture.

## 2023-01-29 NOTE — Progress Notes (Signed)
 Musculoskeletal Exam  Patient: Nancy May DOB: 1995-03-12  DOS: 01/29/2023  SUBJECTIVE:  Chief Complaint:   Chief Complaint  Patient presents with   Back Pain    Back pain    Nancy May is a 28 y.o.  female for evaluation and treatment of back pain.   Onset:  4 days ago. No inj or change in activity.  Location: lower, b/l Character:  sharp and squeezing   Progression of issue:  is unchanged Associated symptoms: radiating to pelvis Denies bowel/bladder incontinence or weakness Treatment: to date has been ice, IV Toradol , heat.   Neurovascular symptoms: no  Patient has a standing history of a cyst in her right hand.  She went to urgent care and was told nothing could be done.  It will wax and wane in size depending on how often she uses her hands.  It is not hurt currently but it does bother her more when she notices it is larger.  She is not interested in aspiration/shot.  Past Medical History:  Diagnosis Date   Allergic rhinitis 05/01/2012   Bacterial vaginosis    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 02/07/20 Retreated 3/24 based on similar presenting symptoms, however benign exam   Lower urinary tract infectious disease 05/01/2012   Nexplanon  in place 04/21/2020   Formatting of this note might be different from the original. 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 Multiple bouts of bacterial vaginosis and feels she cannot clear infection. Wet prep with clue cells, negative whiff 04/08/20 Treat empirically for BV and metrogel  for suppression to follow (she was prescribed in the past but did not use for more than 2 months, will plan for 4 - 6 months maintenance of remission after initial treatment).    Objective:  VITAL SIGNS: BP 126/78   Pulse 88   Temp 98 F (36.7  C) (Oral)   Resp 16   Ht 5' 7 (1.702 m)   Wt 204 lb 9.6 oz (92.8 kg)   LMP 12/31/2022   SpO2 98%   BMI 32.04 kg/m  Constitutional: Well formed, well developed. No acute distress. HENT: Normocephalic, atraumatic.  Thorax & Lungs:  No accessory muscle use Musculoskeletal: low back.   Tenderness to palpation: yes over b/l parasp msc and ES group, mild ttp over b/l SI jt Deformity: no Ecchymosis: no Straight leg test: negative for Poor hamstring flexibility b/l. Nodule noted over the distal palmar flexor tendon of the third digit it is freely movable and semisolid.  There is no TTP.  No overlying erythema or edema. Neurologic: Normal sensory function. No focal deficits noted. Patellar DTR's 0/4 in LE's. No clonus. Psychiatric: Normal mood. Age appropriate judgment and insight. Alert & oriented x 3.    Assessment:  Acute bilateral low back pain, unspecified whether sciatica present - Plan: meloxicam  (MOBIC ) 15 MG tablet, tiZANidine  (ZANAFLEX ) 4 MG tablet, Ambulatory referral to Physical Therapy  Ganglion cyst  Plan: 1. Stretches/exercises, heat, ice, Tylenol , NSAIDs.  Zanaflex  provided.  Warnings about drowsiness verbalized and written down.  She also requested a referral to physical therapy which she placed.  She can always cancel the appointment. 2.  Offered referral to hand surgery as she is not interested in aspiration.  Advised her that aspiration will lead to recurrence 51% of the time.  The patient arrived 9 minutes after her appointment time and, after discussion of the above items, wished to discuss a third issue which I recommended she follow-up for. F/u with the regular PCP as originally scheduled. The patient voiced understanding and agreement to the plan.   Mabel Mt Thompson, DO 01/29/23  9:11 AM

## 2023-01-30 ENCOUNTER — Ambulatory Visit: Payer: Self-pay | Admitting: Family Medicine

## 2023-01-30 ENCOUNTER — Ambulatory Visit: Payer: Medicaid Other | Admitting: Family Medicine

## 2023-01-30 ENCOUNTER — Encounter: Payer: Self-pay | Admitting: Family Medicine

## 2023-01-30 ENCOUNTER — Ambulatory Visit (INDEPENDENT_AMBULATORY_CARE_PROVIDER_SITE_OTHER): Payer: Medicaid Other | Admitting: Family Medicine

## 2023-01-30 VITALS — BP 122/80 | HR 101 | Temp 98.0°F | Resp 16 | Ht 67.0 in | Wt 204.0 lb

## 2023-01-30 DIAGNOSIS — M545 Low back pain, unspecified: Secondary | ICD-10-CM

## 2023-01-30 MED ORDER — TRAMADOL HCL 50 MG PO TABS: 50.0000 mg | ORAL_TABLET | Freq: Three times a day (TID) | ORAL | 0 refills | Status: AC | PRN

## 2023-01-30 NOTE — Progress Notes (Signed)
 Chief Complaint  Patient presents with   Back Pain    Back pain    Subjective: Patient is a 28 y.o. female here for f/u .  Patient was seen yesterday for acute low back pain.  She was started on stretches and exercises, tizanidine , and meloxicam .  She took both this morning and noticed improvement.  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  Bending over has been tough at work.  She is requesting a letter.  No neurologic signs or symptoms, bruising, swelling, redness, or bowel/bladder continence.  Ketorolac  has been helpful.  Past Medical History:  Diagnosis Date   Allergic rhinitis 05/01/2012   Bacterial vaginosis    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 02/07/20 Retreated 3/24 based on similar presenting symptoms, however benign exam   Lower urinary tract infectious disease 05/01/2012   Nexplanon  in place 04/21/2020   Formatting of this note might be different from the original. 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 Multiple bouts of bacterial vaginosis and feels she cannot clear infection. Wet prep with clue cells, negative whiff 04/08/20 Treat empirically for BV and metrogel  for suppression to follow (she was prescribed in the past but did not use for more than 2 months, will plan for 4 - 6 months maintenance of remission after initial treatment).    Objective: BP 122/80   Pulse (!) 101   Temp 98 F (36.7 C) (Oral)   Resp 16   Ht 5' 7 (1.702 m)   Wt 204 lb (92.5 kg)   LMP 12/31/2022   SpO2 97%   BMI 31.95 kg/m  General: Awake, appears stated age MSK: TTP over the paraspinal musculature in the lumbar region in addition to the erector spinae muscle group; less than yesterday. Neuro: 5/5 strength in lower extremities, no clonus, no cerebellar signs, DTRs equal and  symmetric throughout Lungs:  No accessory muscle use Psych: Age appropriate judgment and insight, normal affect and mood  Assessment and Plan: Acute bilateral low back pain, unspecified whether sciatica present - Plan: traMADol  (ULTRAM ) 50 MG tablet  Heat, ice, Tylenol , stretches and exercises.  Use the heat before the stretches.  Hold the stretches for 30 seconds.  Awaiting appointment with physical therapy.  Add tramadol  as needed.  Warned about potential drowsiness with this.  Letter for work written with restrictions for the next 3 weeks including a 20 pound lifting restriction, avoiding bending, and allowing more frequent breaks. The patient voiced understanding and agreement to the plan.  Mabel Mt Slick, DO 01/30/23  2:48 PM

## 2023-01-30 NOTE — Telephone Encounter (Signed)
  Chief Complaint: Low back pain Symptoms: pain Frequency: intermittent x 1 week Pertinent Negatives: Patient denies loss of bowel or bladder, weakness, numbness Disposition: [] ED /[] Urgent Care (no appt availability in office) / [x] Appointment(In office/virtual)/ []  [facility]/ [] Home Care/ [] Refused Recommended Disposition /[] [facility]/ []  Follow-up with PCP Additional Notes: Patient calls stating she is having worsening back pain x 1 week. States she was evaluated yesterday in office as well as in the ED, but the medications received from PCP are not helping her symptoms. Patient is requesting a reevaluation and work note with restrictions if needed, alternative medication if appropriate. Per protocol, patient to be evaluated within 4 hours. Scheduled with next open provider for today at 1415. Care advice reviewed, patient verbalized understanding.   Copied from CRM [id number]. Topic: Clinical - Prescription Issue >> Jan 30, 2023 10:04 AM Montie POUR wrote: Reason for CRM: tiZANidine  (ZANAFLEX ) 4 MG tablet and meloxicam  (MOBIC ) 15 MG tablet was prescribed yesterday and they are not helping with her pain. When she went to the ED, they prescribed her Ketorolac  and this is helping with her pain. Please give her a call as soon as possible. Her number is [phone] Reason for Disposition  [1] SEVERE back pain (e.g., excruciating, unable to do any normal activities) AND [2] not improved 2 hours after pain medicine  Answer Assessment - Initial Assessment Questions 1. ONSET: When did the pain begin?      1 month ago had a fall, pain in back started last week 2. LOCATION: Where does it hurt? (upper, mid or lower back)     Low back 3. SEVERITY: How bad is the pain?  (e.g., Scale 1-10; mild, moderate, or severe)   - MILD (1-3): Doesn't interfere with normal activities.    - MODERATE (4-7): Interferes with normal activities or awakens from sleep.    - SEVERE (8-10):  Excruciating pain, unable to do any normal activities.      8/10 4. PATTERN: Is the pain constant? (e.g., yes, no; constant, intermittent)      Intermittent 5. RADIATION: Does the pain shoot into your legs or somewhere else?     Radiates down R leg 6. CAUSE:  What do you think is causing the back pain?      Unsure 7. BACK OVERUSE:  Any recent lifting of heavy objects, strenuous work or exercise?     Packing boxes for work 8. MEDICINES: What have you taken so far for the pain? (e.g., nothing, acetaminophen , NSAIDS)     Tizanidine  and meloxicam , states it is not helping 9. NEUROLOGIC SYMPTOMS: Do you have any weakness, numbness, or problems with bowel/bladder control?     Denies 10. OTHER SYMPTOMS: Do you have any other symptoms? (e.g., fever, abdomen pain, burning with urination, blood in urine)       Denies  Protocols used: Back Pain-A-AH

## 2023-01-30 NOTE — Patient Instructions (Addendum)
 Continue the stretching.   Use your heating pad as needed and before your stretches.   Do not drink alcohol, do any illicit/street drugs, drive or do anything that requires alertness while on this new pain medicine.   Let us know if you need anything

## 2023-02-12 ENCOUNTER — Telehealth: Payer: Self-pay | Admitting: Family Medicine

## 2023-02-12 DIAGNOSIS — F419 Anxiety disorder, unspecified: Secondary | ICD-10-CM

## 2023-02-12 NOTE — Telephone Encounter (Signed)
Patient made aware. Appt scheduled for follow up.

## 2023-02-12 NOTE — Telephone Encounter (Signed)
You have not seen patient in over a year. Okay to refer to behavioral health?

## 2023-02-12 NOTE — Telephone Encounter (Signed)
Copied from CRM [id number]. Topic: Referral - Request for Referral >> Feb 12, 2023 12:11 PM Denese Killings wrote: Did the patient discuss referral with their provider in the last year? No Patient not sure (If No - schedule appointment) (If Yes - send message)  Appointment offered? Yes  Type of order/referral and detailed reason for visit:  Psychiatry, Patient seen one before through job  Preference of office, provider, location: [place] area, provider and location n/a  If referral order, have you been seen by this specialty before? Yes (If Yes, this issue or another issue? When? Where? [facility] within a year  Can we respond through MyChart? Yes  *Patient wants someone to call her to let her know where she can go.

## 2023-02-17 DIAGNOSIS — Z419 Encounter for procedure for purposes other than remedying health state, unspecified: Secondary | ICD-10-CM | POA: Diagnosis not present

## 2023-02-19 NOTE — Therapy (Addendum)
 OUTPATIENT PHYSICAL THERAPY THORACOLUMBAR EVALUATION   Patient Name: Nancy May MRN: [id number] DOB:March 23, 1995, 28 y.o., female Today's Date: 02/20/2023  END OF SESSION:  PT End of Session - 02/20/23 1400     Visit Number 1    Date for PT Re-Evaluation 04/03/23    Authorization Type [employer]    PT Start Time 1400    PT Stop Time 1448    PT Time Calculation (min) 48 min    Activity Tolerance Patient tolerated treatment well;Patient limited by pain    Behavior During Therapy [facility] for tasks assessed/performed;Agitated             Past Medical History:  Diagnosis Date   Allergic rhinitis 05/01/2012   Bacterial vaginosis    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 02/07/20 Retreated 3/24 based on similar presenting symptoms, however benign exam   Lower urinary tract infectious disease 05/01/2012   Nexplanon  in place 04/21/2020   Formatting of this note might be different from the original. 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 Multiple bouts of bacterial vaginosis and feels she cannot clear infection. Wet prep with clue cells, negative whiff 04/08/20 Treat empirically for BV and metrogel  for suppression to follow (she was prescribed in the past but did not use for more than 2 months, will plan for 4 - 6 months maintenance of remission after initial treatment).   Past Surgical History:  Procedure Laterality Date   NO PAST SURGERIES     Patient Active Problem List   Diagnosis Date Noted   Injury of right ankle 11/21/2021   Dyspnea on exertion 08/29/2021   Family history of coronary artery disease 08/29/2021   Anxiety 08/29/2021   Bacterial vaginosis 08/24/2021   Nexplanon  in place 04/21/2020   History of PID 04/15/2020   Recurrent vaginitis 04/15/2020   Possible  exposure to STD 09/03/2019   Pilonidal sinus 11/28/2018   Allergic rhinitis 05/01/2012   Lower urinary tract infectious disease 05/01/2012    PCP: Almarie Waddell NOVAK, NP   REFERRING PROVIDER: Frann Mabel Mt, DO  REFERRING DIAG: M54.50 (ICD-10-CM) - Acute bilateral low back pain, unspecified whether sciatica present   THERAPY DIAG:  Other low back pain  Pain in thoracic spine  Abnormal posture  Muscle weakness (generalized)  Muscle spasm of back  RATIONALE FOR EVALUATION AND TREATMENT: Rehabilitation  ONSET DATE: ~3-4 weeks (01/28/23 - ED visit due to severe LBP)  NEXT MD VISIT:  None scheduled / PRN with referring provider; 02/21/23 with Almarie Waddell NOVAK, NP    SUBJECTIVE:  SUBJECTIVE STATEMENT: Pt reports long h/o upper back problems since 2018 - only treated with pain meds w/o much relief.  Now finally referred to PT.  On 01/28/23 she ended up in ED due to severe LBP.  Uncertain MOI but did report 2 falls due to ice on stairs at her apartment in the last 4-6 weeks. Continues to have both upper and lower back pain.  Went to chiropractor ~2 weeks ago with significant reduction in LBP.  Radicular pain down R>L leg to heel with possible tingling but no numbness.  She also B knee pain for the past 2 years.  PAIN: Are you having pain? Yes: NPRS scale: 7/10 currently, at worst up to 9-10/10 at night  Pain location: thoracic spine between scapulae, periscapular, B upper shoulders  Pain description: sharp, aching, pinched nerve, stiffness  Aggravating factors: walking, bending over, lifting heavy items, cold temperatures  Relieving factors: crack my neck or back, chiropractor, stretch   Are you having pain? Yes: NPRS scale: 0/10 currently, 8/10 on average  Pain location: R>L low back  and upper buttocks  Pain description: locked up, severe Aggravating factors: uncertain, maybe a result of 2 falls on the stairs in the past 1-2 months  Relieving factors: nothing, some relief from chiropractor visit   PERTINENT HISTORY:  PID, DOE, anxiety, active pilonidal cyst   PRECAUTIONS: None  RED FLAGS: None  WEIGHT BEARING RESTRICTIONS: No  FALLS:  Has patient fallen in last 6 months? Yes. 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   OBJECTIVE: (objective measures completed at initial evaluation unless otherwise dated)  DIAGNOSTIC FINDINGS:  01/28/23 - CT lumbar spine: IMPRESSION: 1. No acute osseous abnormality.  PATIENT SURVEYS:  Modified Oswestry 28 / 50 = 56.0 %, indicating severe disability   SCREENING FOR RED FLAGS: Bowel or bladder incontinence: No Spinal tumors: No Cauda equina syndrome: No Compression fracture: No Abdominal aneurysm: No  COGNITION:  Overall cognitive status: Within functional limits for tasks assessed    SENSATION: WFL  POSTURE:  rounded shoulders, forward head, increased lumbar lordosis, anterior pelvic tilt, and weight shift left  PALPATION: TTP over B thoracolumbar paraspinals, B upper shoulders and periscapular muscles and B upper glutes  THORACOLUMBAR ROM:  WFL/WNL but all motions increase her pain  MUSCLE LENGTH: Hamstrings: excessively flexible  ITB: WNL Piriformis: WNL Hip flexors: WNL Quads: WNL Heelcord: WNL  LOWER EXTREMITY ROM:    B LE WNL  LOWER EXTREMITY MMT:    MMT Right eval Left eval  Hip flexion 4 4+  Hip extension 4+ 4  Hip abduction 4 4+  Hip  adduction 5 5  Hip internal rotation 4+ 4+  Hip external rotation 4+ 4+  Knee flexion 5 5  Knee extension 5 5  Ankle dorsiflexion 5 5  Ankle plantarflexion    Ankle inversion    Ankle eversion     (Blank rows = not tested)  LUMBAR SPECIAL TESTS:  Straight leg raise test: Negative, Slump test: Negative, FABER test: Negative, and SIJ alignment neutral   TODAY'S TREATMENT:   02/19/2023 - Eval SELF CARE: Reviewed eval findings and role of PT in addressing identified deficits. HEP  to be established next visit.  PATIENT EDUCATION:  Education details: PT eval findings and anticipated POC Person educated: Patient Education method: Explanation Education comprehension: verbalized understanding  HOME EXERCISE PROGRAM: TBD   ASSESSMENT:  CLINICAL IMPRESSION: Nancy May is a 28 y.o. female who was referred to physical therapy for evaluation and treatment for acute B low back pain with intermittent B LE radiculopathy.  She also reports chronic upper back/thoracic back and periscapular pain.  Patient reports onset of B LBP pain beginning mid-January, potentially the result of 2 falls on the stairs at her apartment complex.  Upper back pain has been an issue since 2018. Upper back pain is worse with walking, bending over, lifting heavy items, and cold temperatures; however patient unable to identify aggravating factors for LBP.  Lumbar and LE ROM WFL/WNL but all lumbar motions result in increased pain with no directional preference identified.  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  Mild proximal LE and core weakness, abnormal posture, and TTP with abnormal muscle tension identified which are interfering with ADLs and are impacting quality of life.  On Modified Oswestry patient scored 28/50 demonstrating 56% or severe disability.  Nancy May will benefit from skilled PT to address above deficits to improve mobility and activity  tolerance with decreased pain interference.     OBJECTIVE IMPAIRMENTS: decreased activity tolerance, decreased endurance, decreased knowledge of condition, difficulty walking, decreased strength, increased fascial restrictions, impaired perceived functional ability, increased muscle spasms, impaired flexibility, improper body mechanics, postural dysfunction, and pain.   ACTIVITY LIMITATIONS: carrying, lifting, bending, sitting, standing, squatting, sleeping, and locomotion level  PARTICIPATION LIMITATIONS: meal prep, cleaning, laundry, driving, shopping, community activity, and occupation  PERSONAL FACTORS: Age, Behavior pattern, Fitness, Past/current experiences, Profession, Time since onset of injury/illness/exacerbation, and 3+ comorbidities: PID, DOE, anxiety, and pilonidal cyst  are also affecting patient's functional outcome.   REHAB POTENTIAL: Good  CLINICAL DECISION MAKING: Evolving/moderate complexity  EVALUATION COMPLEXITY: Moderate   GOALS: Goals reviewed with patient? Yes  SHORT TERM GOALS: Target date: 03/13/2023  Patient will be independent with initial HEP to improve outcomes and carryover.  Baseline:  Goal status: INITIAL  2.  Patient will report 25% improvement in back pain to improve QOL. Baseline: Upper back - 7/10 on eval, at worst up to 9-10/10 at night; Low back - 0/10 on eval, 8/10 on average Goal status: INITIAL  LONG TERM GOALS: Target date: 04/03/2023  Patient will be independent with ongoing/advanced HEP for self-management at home.  Baseline:  Goal status: INITIAL  2.  Patient will report 50-75% improvement in low back pain to improve QOL.  Baseline: Upper back - 7/10 on eval, at worst up to 9-10/10 at night; Low back - 0/10 on eval, 8/10 on average Goal status: INITIAL  3.  Patient to demonstrate ability to achieve and maintain good spinal alignment/posturing and body mechanics needed for daily activities. Baseline:  Goal status: INITIAL  4.   Patient will demonstrate full pain free lumbar ROM to perform ADLs.   Baseline: Lumbar ROM WFL but pain with all motions Goal status: INITIAL  5.  Patient will demonstrate improved B proximal LE strength to 5/5 for improved stability and ease of mobility. Baseline: Refer to above LE MMT table Goal status: INITIAL  6. Patient will report </= 44% on Modified Oswestry (MCID = 12%) to demonstrate improved functional ability with decreased pain interference. Baseline: 28 / 50 = 56.0 % Goal status: INITIAL  7.  Patient to report ability to perform ADLs, household, and work-related tasks without increased back pain. Baseline:  Goal status: INITIAL   PLAN:  PT FREQUENCY: 2x/week  PT DURATION: 6 weeks  PLANNED INTERVENTIONS: 97164- PT Re-evaluation, 97110-Therapeutic exercises, 97530- Therapeutic activity, [id number]- Neuromuscular re-education, 97535- Self Care, [id number]- Manual therapy, [id number]- Aquatic Therapy, 97014- Electrical stimulation (unattended), [id number]- Electrical stimulation (manual), [id number]- Ultrasound, 02987- Traction (mechanical), [id number]- Ionotophoresis 4mg /ml Dexamethasone , Patient/Family education, Balance training, Taping, Dry Needling, Joint mobilization, Spinal mobilization, Cryotherapy, and Moist heat  PLAN FOR NEXT SESSION: Create initial HEP for postural and core stabilization/strengthening;  MT +/- DN (pt wishes to try other options first) to address abnormal muscle tension; modalities PRN for pain management - possible trial of estim/TENS (provide info on home TENS unit options if benefit noted)   Elijah CHRISTELLA Hidden, PT 02/20/2023, 4:32 PM

## 2023-02-20 ENCOUNTER — Other Ambulatory Visit: Payer: Self-pay

## 2023-02-20 ENCOUNTER — Ambulatory Visit: Payer: Medicaid Other | Attending: Family Medicine | Admitting: Physical Therapy

## 2023-02-20 ENCOUNTER — Encounter: Payer: Self-pay | Admitting: Physical Therapy

## 2023-02-20 DIAGNOSIS — R293 Abnormal posture: Secondary | ICD-10-CM | POA: Diagnosis not present

## 2023-02-20 DIAGNOSIS — M6281 Muscle weakness (generalized): Secondary | ICD-10-CM | POA: Insufficient documentation

## 2023-02-20 DIAGNOSIS — M62830 Muscle spasm of back: Secondary | ICD-10-CM | POA: Insufficient documentation

## 2023-02-20 DIAGNOSIS — M545 Low back pain, unspecified: Secondary | ICD-10-CM | POA: Diagnosis not present

## 2023-02-20 DIAGNOSIS — M5459 Other low back pain: Secondary | ICD-10-CM | POA: Diagnosis not present

## 2023-02-20 DIAGNOSIS — M546 Pain in thoracic spine: Secondary | ICD-10-CM | POA: Diagnosis not present

## 2023-02-21 ENCOUNTER — Ambulatory Visit: Payer: Medicaid Other | Admitting: Family Medicine

## 2023-02-21 ENCOUNTER — Other Ambulatory Visit: Payer: Self-pay

## 2023-02-21 ENCOUNTER — Encounter: Payer: Self-pay | Admitting: Family Medicine

## 2023-02-21 VITALS — BP 129/86 | HR 95 | Ht 67.0 in | Wt 205.0 lb

## 2023-02-21 DIAGNOSIS — M545 Low back pain, unspecified: Secondary | ICD-10-CM

## 2023-02-21 DIAGNOSIS — L0591 Pilonidal cyst without abscess: Secondary | ICD-10-CM

## 2023-02-21 DIAGNOSIS — K0510 Chronic gingivitis, plaque induced: Secondary | ICD-10-CM

## 2023-02-21 DIAGNOSIS — F419 Anxiety disorder, unspecified: Secondary | ICD-10-CM

## 2023-02-21 DIAGNOSIS — F32A Depression, unspecified: Secondary | ICD-10-CM

## 2023-02-21 DIAGNOSIS — B9689 Other specified bacterial agents as the cause of diseases classified elsewhere: Secondary | ICD-10-CM

## 2023-02-21 MED ORDER — METRONIDAZOLE 0.75 % VA GEL: 1.0000 | Freq: Every day | VAGINAL | 5 refills | Status: DC

## 2023-02-21 MED ORDER — AMOXICILLIN 500 MG PO CAPS: 500.0000 mg | ORAL_CAPSULE | Freq: Three times a day (TID) | ORAL | 0 refills | Status: AC

## 2023-02-21 NOTE — Progress Notes (Signed)
 Established Patient Office Visit  Subjective   Patient ID: Nancy May, female    DOB: 08/02/1995  Age: 28 y.o. MRN: [id number]  Chief Complaint  Patient presents with   Medical Management of Chronic Issues    HPI  Discussed the use of AI scribe software for clinical note transcription with the patient, who gave verbal consent to proceed.  History of Present Illness   Nancy May is a 28 year old female who presents with ongoing back pain and issues with medical referrals.  She has been experiencing ongoing back pain for the past two to three weeks, which she feels has not been adequately addressed despite multiple medical visits. Initially, she sought care in the emergency room, where imaging was performed, and she was prescribed pain medications including NSAIDs and tramadol . However, these medications have not provided relief. She experienced significant pain relief after visiting a chiropractor. She has recently started physical therapy but has not yet engaged in the exercises. The pain is localized to the middle of her back with no radiation down her legs. She prefers to continue current treatment plan before requesting specialist evaluation.   She reports an issue with a referral for counseling resulting in her being unable to secure an appointment with a therapist - per chart review, looks like previous referral did not accept her insurance; we will try somewhere else.   She mentions a dental issue with swollen gums on the right side of her mouth, causing significant pain rated as a ten out of ten at times. This has been ongoing for a couple of weeks before January 14th. She has difficulty eating due to the pain and swelling, and she does not currently have a dentist due to insurance changes. No fever associated with the dental pain.  She has a recurring cyst that has been infected multiple times, approximately eight to ten times, and has been drained once surgically  about a year or two ago. The cyst is currently bothering her, causing significant discomfort, and she is unable to lay on her back due to the pain.                 02/21/2023    4:23 PM 01/30/2023    2:30 PM 11/21/2021   10:31 AM  PHQ9 SCORE ONLY  PHQ-9 Total Score 9 0 0      02/21/2023    4:23 PM 11/28/2018    9:05 AM  GAD 7 : Generalized Anxiety Score  Nervous, Anxious, on Edge 3 0  Control/stop worrying 3 0  Worry too much - different things 3 0  Trouble relaxing 3 0  Restless 2 0  Easily annoyed or irritable 3 0  Afraid - awful might happen 1 0  Total GAD 7 Score 18 0  Anxiety Difficulty Very difficult          ROS All review of systems negative except what is listed in the HPI    Objective:     BP 129/86   Pulse 95   Ht 5' 7 (1.702 m)   Wt 205 lb (93 kg)   LMP 12/31/2022   SpO2 100%   BMI 32.11 kg/m    Physical Exam Vitals reviewed.  Constitutional:      Appearance: Normal appearance.  HENT:     Mouth/Throat:     Comments: Unremarkable, possibly mild inflammation and erythema to upper gums, no obvious abscess  Cardiovascular:     Rate and Rhythm: Normal rate and  regular rhythm.  Pulmonary:     Effort: Pulmonary effort is normal.     Breath sounds: Normal breath sounds.  Musculoskeletal:        General: Normal range of motion.       Back:     Comments: <1cm area of firmness/cyst, no abscess, fluctuance, induration, erythema  Skin:    General: Skin is warm and dry.  Neurological:     Mental Status: She is alert.  Psychiatric:        Mood and Affect: Mood normal.        Behavior: Behavior normal.        Thought Content: Thought content normal.        Judgment: Judgment normal.      No results found for any visits on 02/21/23.    The ASCVD Risk score (Arnett DK, et al., 2019) failed to calculate for the following reasons:   The 2019 ASCVD risk score is only valid for ages 59 to 51    Assessment & Plan:   Problem List Items  Addressed This Visit       Active Problems   Anxiety and depression   Previous referral for counseling not successful due to clinic not accepting new patients. No SI/HI.  -Attempt to send referral to a different counseling service.          Relevant Orders   Ambulatory referral to Behavioral Health   Other Visit Diagnoses       Gum inflammation    -  Primary Reports of swollen gums and pain on right side of mouth for a few weeks. No obvious abscess or broken teeth noted on examination. -Prescribe Amoxicillin  to cover potential oral infection. -Advise patient to follow up with a dentist.   Relevant Medications   amoxicillin  (AMOXIL ) 500 MG capsule     Pilonidal cyst     Recurrent infections, currently flared up. Previous incision and drainage performed a year or two ago. -Refer to surgery for consultation regarding potential removal of cyst.   Relevant Orders   Ambulatory referral to General Surgery     Acute bilateral low back pain, unspecified whether sciatica present     Ongoing issue, currently in physical therapy. No radicular symptoms. Pain improved after chiropractic treatment. -Continue physical therapy. -If no improvement after a few weeks, consider referral to orthopedic specialist.       Return if symptoms worsen or fail to improve, for schedule annual physical; due for follow-up with your cardiologist.    [name], NP

## 2023-02-21 NOTE — Assessment & Plan Note (Signed)
 Previous referral for counseling not successful due to clinic not accepting new patients. No SI/HI.  -Attempt to send referral to a different counseling service.

## 2023-02-23 ENCOUNTER — Encounter (HOSPITAL_BASED_OUTPATIENT_CLINIC_OR_DEPARTMENT_OTHER): Payer: Self-pay | Admitting: Emergency Medicine

## 2023-02-23 ENCOUNTER — Emergency Department (HOSPITAL_BASED_OUTPATIENT_CLINIC_OR_DEPARTMENT_OTHER)
Admission: EM | Admit: 2023-02-23 | Discharge: 2023-02-23 | Disposition: A | Discharge status: Home / Self Care | Payer: Medicaid Other | Attending: Emergency Medicine | Admitting: Emergency Medicine

## 2023-02-23 ENCOUNTER — Other Ambulatory Visit: Payer: Self-pay

## 2023-02-23 DIAGNOSIS — L0501 Pilonidal cyst with abscess: Secondary | ICD-10-CM | POA: Insufficient documentation

## 2023-02-23 LAB — PREGNANCY, URINE: Preg Test, Ur: NEGATIVE

## 2023-02-23 MED ORDER — SULFAMETHOXAZOLE-TRIMETHOPRIM 800-160 MG PO TABS: 1.0000 | ORAL_TABLET | Freq: Two times a day (BID) | ORAL | 0 refills | Status: AC

## 2023-02-23 MED ORDER — IBUPROFEN 600 MG PO TABS: 600.0000 mg | ORAL_TABLET | Freq: Four times a day (QID) | ORAL | 0 refills | Status: AC | PRN

## 2023-02-23 MED ORDER — LIDOCAINE-EPINEPHRINE (PF) 2 %-1:200000 IJ SOLN
10.0000 mL | Freq: Once | INTRAMUSCULAR | Status: AC
  Administered 2023-02-23: 10 mL via INTRADERMAL
  Filled 2023-02-23: qty 20

## 2023-02-23 MED ORDER — IBUPROFEN 800 MG PO TABS
800.0000 mg | ORAL_TABLET | Freq: Once | ORAL | Status: AC
  Administered 2023-02-23: 800 mg via ORAL
  Filled 2023-02-23: qty 1

## 2023-02-23 NOTE — ED Triage Notes (Signed)
 Pt has abscess (pilonidal) that has gotten worse.  Pt states she cannot sit.  She has appt with surgeon but pain has increased.

## 2023-02-23 NOTE — ED Provider Notes (Signed)
 [place] [facility] Provider Note   CSN: [id number] Arrival date & time: 02/23/23  1015     History  Chief Complaint  Patient presents with   Abscess    Nancy May is a 28 y.o. female.  The history is provided by the patient and medical records. No language interpreter was used.  Abscess    28 year old female history of pilonidal cyst presenting complaining of skin abscess.  Patient states more than a month she has had progressive worsening pain and swelling and redness to her pilonidal region.  She was recently seen by her doctor and was giving a referral to a surgeon to be seen in next several days but due to worsening symptoms, she is here for management.  She has been using warm compress without adequate relief.  She does not endorse any fever no rectal discomfort no trouble with a bowel movement.  Denies any fever or chills.  No trauma.  Last menstruation was a week ago.    Home Medications Prior to Admission medications   Medication Sig Start Date End Date Taking? Authorizing Provider  amoxicillin  (AMOXIL ) 500 MG capsule Take 1 capsule (500 mg total) by mouth 3 (three) times daily for 5 days. 02/21/23 02/26/23  Almarie Waddell NOVAK, NP  ketorolac  (TORADOL ) 10 MG tablet Take 10 mg by mouth every 6 (six) hours as needed for severe pain (pain score 7-10).    [provider]  meloxicam  (MOBIC ) 7.5 MG tablet Take 7.5 mg by mouth daily.    [provider]  metroNIDAZOLE  (METROGEL ) 0.75 % vaginal gel Place 1 Applicatorful vaginally at bedtime. Apply one applicatorful to vagina at bedtime for 10 days, then twice a week for 6 months. 02/21/23   Ajewole, Christana, MD  traMADol  (ULTRAM ) 50 MG tablet Take 50 mg by mouth every 6 (six) hours as needed for severe pain (pain score 7-10).    [provider]      Allergies    Patient has no known allergies.    Review of Systems   Review of Systems  All other systems reviewed and  are negative.   Physical Exam Updated Vital Signs BP (!) 122/90 (BP Location: Right Arm)   Pulse 82   Temp 98.4 F (36.9 C) (Oral)   Resp 18   Ht 5' 7 (1.702 m)   Wt 93 kg   LMP 12/31/2022   SpO2 100%   BMI 32.11 kg/m  Physical Exam Vitals and nursing note reviewed.  Constitutional:      General: She is not in acute distress.    Appearance: She is well-developed.  HENT:     Head: Atraumatic.  Eyes:     Conjunctiva/sclera: Conjunctivae normal.  Pulmonary:     Effort: Pulmonary effort is normal.  Genitourinary:    Comments: Chaperone present during exam.  Exquisite tenderness noted to pilonidal region with surrounding skin erythema and some mild firmness noted.  No rectal involvement. Musculoskeletal:     Cervical back: Neck supple.  Skin:    Findings: No rash.  Neurological:     Mental Status: She is alert.  Psychiatric:        Mood and Affect: Mood normal.     ED Results / Procedures / Treatments   Labs (all labs ordered are listed, but only abnormal results are displayed) Labs Reviewed  PREGNANCY, URINE    EKG None  Radiology No results found.  Procedures .Incision and Drainage  Date/Time: 02/23/2023 11:47  AM  Performed by: Nivia Colon, PA-C Authorized by: Nivia Colon, PA-C   Consent:    Consent obtained:  Verbal   Consent given by:  Patient   Risks discussed:  Bleeding, incomplete drainage, pain and damage to other organs   Alternatives discussed:  No treatment Universal protocol:    Procedure explained and questions answered to patient or proxy's satisfaction: yes     Relevant documents present and verified: yes     Test results available : yes     Imaging studies available: yes     Required blood products, implants, devices, and special equipment available: yes     Site/side marked: yes     Immediately prior to procedure, a time out was called: yes     Patient identity confirmed:  Verbally with patient Location:    Type:  Pilonidal cyst    Size:  4   Location:  Anogenital   Anogenital location:  Pilonidal Pre-procedure details:    Skin preparation:  Betadine Anesthesia:    Anesthesia method:  Local infiltration   Local anesthetic:  Lidocaine  2% WITH epi Procedure type:    Complexity:  Complex Procedure details:    Incision types:  Single straight   Incision depth:  Subcutaneous   Wound management:  Probed and deloculated, irrigated with saline and extensive cleaning   Drainage:  Purulent   Drainage amount:  Moderate   Packing materials:  1/4 in gauze Post-procedure details:    Procedure completion:  Tolerated with difficulty     Medications Ordered in ED Medications  ibuprofen  (ADVIL ) tablet 800 mg (has no administration in time range)  lidocaine -EPINEPHrine  (XYLOCAINE  W/EPI) 2 %-1:200000 (PF) injection 10 mL (10 mLs Intradermal Given 02/23/23 1118)    ED Course/ Medical Decision Making/ A&P                                 Medical Decision Making  BP (!) 122/90 (BP Location: Right Arm)   Pulse 82   Temp 98.4 F (36.9 C) (Oral)   Resp 18   Ht 5' 7 (1.702 m)   Wt 93 kg   LMP 12/31/2022   SpO2 100%   BMI 32.11 kg/m   89:34 AM  28 year old female history of pilonidal cyst presenting complaining of skin abscess.  Patient states more than a month she has had progressive worsening pain and swelling and redness to her pilonidal region.  She was recently seen by her doctor and was giving a referral to a surgeon to be seen in next several days but due to worsening symptoms, she is here for management.  She has been using warm compress without adequate relief.  She does not endorse any fever no rectal discomfort no trouble with a bowel movement.  Denies any fever or chills.  No trauma.  Last menstruation was a week ago.  Exam notable for tenderness to the pilonidal region concerning for infected pilonidal cyst.  No evidence of perianal abscess.  Pregnancy test is negative.  I have performed incision and drainage  to the infected pilonidal cyst and was able to evacuate a moderate amount of purulent discharge.  Packing was placed and patient instructed to remove packing in 48 hours.  Will discharge home with pain medication as well as antibiotic.  Wound care instruction provided.  Return precaution given.        Final Clinical Impression(s) / ED Diagnoses Final diagnoses:  Pilonidal  abscess    Rx / DC Orders ED Discharge Orders          Ordered    ibuprofen  (ADVIL ) 600 MG tablet  Every 6 hours PRN        02/23/23 1153    sulfamethoxazole -trimethoprim  (BACTRIM  DS) 800-160 MG tablet  2 times daily        02/23/23 1153              Nivia Colon, PA-C 02/23/23 1155    Franklyn Sid SAILOR, MD 02/23/23 1525

## 2023-02-23 NOTE — ED Notes (Addendum)
 Dressing applied to buttocks per EDP request. Non adherent dressing placed with gauze. Pt was educated on how to reapply dresssing.

## 2023-02-23 NOTE — Discharge Instructions (Signed)
 You have been diagnosed with an infected pilonidal cyst.  It has been incised and drained in the ER.  You may follow-up with your doctor in 48 hours for wound recheck and removal of packing.  You may also remove the packing by yourself after 48 hours.  Apply warm compress to affected area several times a day to aid with healing. take antibiotic as prescribed.  Take pain medication as needed.  Return if you have any concern.

## 2023-02-26 ENCOUNTER — Encounter: Payer: Self-pay | Admitting: Physical Therapy

## 2023-02-26 ENCOUNTER — Ambulatory Visit: Payer: Medicaid Other | Admitting: Physical Therapy

## 2023-02-26 DIAGNOSIS — M6281 Muscle weakness (generalized): Secondary | ICD-10-CM | POA: Diagnosis not present

## 2023-02-26 DIAGNOSIS — M5459 Other low back pain: Secondary | ICD-10-CM | POA: Diagnosis not present

## 2023-02-26 DIAGNOSIS — M546 Pain in thoracic spine: Secondary | ICD-10-CM | POA: Diagnosis not present

## 2023-02-26 DIAGNOSIS — M62830 Muscle spasm of back: Secondary | ICD-10-CM | POA: Diagnosis not present

## 2023-02-26 DIAGNOSIS — L0501 Pilonidal cyst with abscess: Secondary | ICD-10-CM | POA: Diagnosis not present

## 2023-02-26 DIAGNOSIS — M545 Low back pain, unspecified: Secondary | ICD-10-CM | POA: Diagnosis not present

## 2023-02-26 DIAGNOSIS — R293 Abnormal posture: Secondary | ICD-10-CM

## 2023-02-26 NOTE — Therapy (Addendum)
 OUTPATIENT PHYSICAL THERAPY TREATMENT / DISCHARGE SUMMARY   Patient Name: Nancy May MRN: [id number] DOB:1995/07/24, 28 y.o., female Today's Date: 02/26/2023  END OF SESSION:  PT End of Session - 02/26/23 1620     Visit Number 2    Date for PT Re-Evaluation 04/03/23    Authorization Type [employer]    Authorization Time Period 02/20/23 - 04/21/23    Authorization - Visit Number 1    Authorization - Number of Visits 2    PT Start Time 1620    PT Stop Time 1659    PT Time Calculation (min) 39 min    Activity Tolerance Patient tolerated treatment well    Behavior During Therapy [facility] for tasks assessed/performed              Past Medical History:  Diagnosis Date   Allergic rhinitis 05/01/2012   Bacterial vaginosis    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 02/07/20 Retreated 3/24 based on similar presenting symptoms, however benign exam   Lower urinary tract infectious disease 05/01/2012   Nexplanon  in place 04/21/2020   Formatting of this note might be different from the original. 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 Multiple bouts of bacterial vaginosis and feels she cannot clear infection. Wet prep with clue cells, negative whiff 04/08/20 Treat empirically for BV and metrogel  for suppression to follow (she was prescribed in the past but did not use for more than 2 months, will plan for 4 - 6 months maintenance of remission after initial treatment).   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   Allergic rhinitis 05/01/2012   Lower urinary tract infectious disease 05/01/2012    PCP: Almarie Waddell NOVAK, NP   REFERRING PROVIDER: Frann Mabel Mt, DO  REFERRING DIAG: M54.50 (ICD-10-CM) - Acute bilateral low back pain, unspecified whether sciatica present   THERAPY DIAG:  Other low back pain  Pain in thoracic spine  Abnormal posture  Muscle weakness (generalized)  Muscle spasm of back  RATIONALE FOR EVALUATION AND TREATMENT: Rehabilitation  ONSET DATE: ~3-4 weeks (01/28/23 - ED visit due to severe LBP)  NEXT MD VISIT:  None scheduled / PRN with referring provider; 02/21/23 with Almarie Waddell NOVAK, NP    SUBJECTIVE:  SUBJECTIVE STATEMENT: Pt reports she has started having sharp pains down the back of her legs 3-5x/day x ~2 days per week.   EVAL:  Pt reports long h/o upper back problems since 2018 - only treated with pain meds w/o much relief.  Now finally referred to PT.  On 01/28/23 she ended up in ED due to severe LBP.  Uncertain MOI but did report 2 falls due to ice on stairs at her apartment in the last 4-6 weeks. Continues to have both upper and lower back pain.  Went to chiropractor ~2 weeks ago with significant reduction in LBP.  Radicular pain down R>L leg to heel with possible tingling but no numbness.  She also B knee pain for the past 2 years.  PAIN: Are you having pain? Yes: NPRS scale: 4/10   Pain location: thoracic spine between scapulae, periscapular, B upper shoulders  Pain description: sharp, aching, pinched nerve, stiffness  Aggravating factors: walking, bending over, lifting heavy items, cold  temperatures  Relieving factors: crack my neck or back, chiropractor, stretch   Are you having pain? Yes: NPRS scale: 0/10   Pain location: R>L low back and upper buttocks  Pain description: locked up, severe Aggravating factors: uncertain, maybe a result of 2 falls on the stairs in the past 1-2 months  Relieving factors: nothing, some relief from chiropractor visit   PERTINENT HISTORY:  PID, DOE, anxiety, active pilonidal cyst   PRECAUTIONS: None  RED FLAGS: None  WEIGHT BEARING RESTRICTIONS: No  FALLS:  Has patient fallen in last 6 months? Yes. 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   OBJECTIVE: (objective measures completed at initial evaluation unless otherwise dated)  DIAGNOSTIC FINDINGS:  01/28/23 - CT lumbar spine: IMPRESSION: 1. No acute osseous abnormality.  PATIENT SURVEYS:  Modified Oswestry 28 / 50 = 56.0 %, indicating severe disability   SCREENING FOR RED FLAGS: Bowel or bladder incontinence: No Spinal tumors: No Cauda equina syndrome: No Compression fracture: No Abdominal aneurysm: No  COGNITION:  Overall cognitive status: Within functional limits for tasks assessed    SENSATION: WFL  POSTURE:  rounded shoulders, forward head, increased lumbar lordosis, anterior pelvic tilt, and weight shift left  PALPATION: TTP over B thoracolumbar paraspinals, B upper shoulders and periscapular muscles and B upper glutes  THORACOLUMBAR ROM:  WFL/WNL but all motions increase her pain  MUSCLE LENGTH: Hamstrings: excessively flexible  ITB: WNL Piriformis: WNL Hip flexors: WNL Quads: WNL Heelcord:  WNL  LOWER EXTREMITY ROM:    B LE WNL  LOWER EXTREMITY MMT:    MMT Right eval Left eval  Hip flexion 4 4+  Hip extension 4+ 4  Hip abduction 4 4+  Hip adduction 5 5  Hip internal rotation 4+ 4+  Hip external rotation 4+ 4+  Knee flexion 5 5  Knee extension 5 5  Ankle dorsiflexion 5 5  Ankle plantarflexion    Ankle inversion    Ankle eversion     (Blank rows = not tested)  LUMBAR SPECIAL TESTS:  Straight leg raise test: Negative, Slump test: Negative, FABER test: Negative, and SIJ alignment neutral   TODAY'S TREATMENT:   02/26/2023  THERAPEUTIC  EXERCISE: To improve strength and endurance.  Demonstration, verbal and tactile cues throughout for technique.  Rec Bike - L1-2 x 6 min LTR 10 x 5 - emphasis on core activation Hooklying PPT 10 x 5 Hooklying PPT + scap retraction + RTB shoulder horizontal ABD 10 x 3, 2 sets Hooklying PPT + GTB bent knee fallout 10 x 5, 2 sets Hooklying PPT + scap retraction + RTB shoulder horizontal ER 10 x 3 - increased mid back/medial scapular pain reported S/L open book stretch 10 x 5, bil Hooklying PPT + GTB march 10 x 5, 2 sets Bridge + GTB hip ABD 10 x 5 - cues to increase abdominal muscle engagement to avoid arching back   02/19/2023 - Eval SELF CARE: Reviewed eval findings and role of PT in addressing identified deficits. HEP to be established next visit.   PATIENT EDUCATION:  Education details: initial HEP and postural awareness Person educated: Patient Education method: Explanation, Demonstration, Verbal cues, Handouts, and instructions provided for Med[employer] app access Education comprehension: verbalized understanding, returned demonstration, verbal cues required, and needs further education  HOME EXERCISE PROGRAM: *Pt using [employer] app  Access Code: [email or web address] URL: [email or web address] Date: 02/26/2023 Prepared by: Elijah Hidden  Exercises - Supine Lower Trunk Rotation  - 1 x daily - 7 x weekly - 2  sets - 10 reps - 5 sec hold - Supine Posterior Pelvic Tilt  - 1 x daily - 7 x weekly - 2 sets - 10 reps - 5 sec hold - Hooklying Single Leg Bent Knee Fallouts with Resistance  - 1 x daily - 7 x weekly - 2 sets - 10 reps - 3 sec hold - Supine March with Resistance Band  - 1 x daily - 7 x weekly - 2 sets - 10 reps - 2-3 sec hold hold - Bridge with Resistance  - 1 x daily - 7 x weekly - 2 sets - 10 reps - 5 sec hold - Sidelying Thoracic Rotation with Open Book  - 1 x daily - 7 x weekly - 2 sets - 10 reps - 5 sec hold - Supine Shoulder Horizontal Abduction with Resistance  - 1 x daily - 7 x weekly - 2 sets - 10 reps - 3-5 sec hold   ASSESSMENT:  CLINICAL IMPRESSION: Treatment session today focusing on establishing an initial HEP.  As overall flexibility is WNL or beyond normal flexibility, therapeutic exercises targeting core and postural muscle activation to provide stability for spine.  Cues necessary t/o exercise instruction to focus on abdominal muscle activation and appropriate hold times to ensure optimal muscle activation and avoid reliance on momentum or Theraband recoil.  Increased mid thoracic pain reported with hooklying B scapular retraction and shoulder ER, but otherwise all exercises well tolerated w/o complaints.  Lindell will benefit from continued skilled PT to ongoing strength and stability deficits to improve mobility and activity tolerance with decreased pain interference.     OBJECTIVE IMPAIRMENTS: decreased activity tolerance, decreased endurance, decreased knowledge of condition, difficulty walking, decreased strength, increased fascial restrictions, impaired perceived functional ability, increased muscle spasms, impaired flexibility, improper body mechanics, postural dysfunction, and pain.   ACTIVITY LIMITATIONS: carrying, lifting, bending, sitting, standing, squatting, sleeping, and locomotion level  PARTICIPATION LIMITATIONS: meal prep, cleaning, laundry, driving, shopping,  community activity, and occupation  PERSONAL FACTORS: Age, Behavior pattern, Fitness, Past/current experiences, Profession, Time since onset of injury/illness/exacerbation, and 3+ comorbidities: PID, DOE, anxiety, and pilonidal cyst are also affecting patient's functional outcome.   REHAB POTENTIAL:  Good  CLINICAL DECISION MAKING: Evolving/moderate complexity  EVALUATION COMPLEXITY: Moderate   GOALS: Goals reviewed with patient? Yes  SHORT TERM GOALS: Target date: 03/13/2023  Patient will be independent with initial HEP to improve outcomes and carryover.  Baseline:  Goal status: IN PROGRESS  2.  Patient will report 25% improvement in back pain to improve QOL. Baseline: Upper back - 7/10 on eval, at worst up to 9-10/10 at night; Low back - 0/10 on eval, 8/10 on average Goal status: IN PROGRESS  LONG TERM GOALS: Target date: 04/03/2023  Patient will be independent with ongoing/advanced HEP for self-management at home.  Baseline:  Goal status: IN PROGRESS  2.  Patient will report 50-75% improvement in low back pain to improve QOL.  Baseline: Upper back - 7/10 on eval, at worst up to 9-10/10 at night; Low back - 0/10 on eval, 8/10 on average Goal status: IN PROGRESS  3.  Patient to demonstrate ability to achieve and maintain good spinal alignment/posturing and body mechanics needed for daily activities. Baseline:  Goal status: IN PROGRESS  4.  Patient will demonstrate full pain free lumbar ROM to perform ADLs.   Baseline: Lumbar ROM WFL but pain with all motions Goal status: IN PROGRESS  5.  Patient will demonstrate improved B proximal LE strength to 5/5 for improved stability and ease of mobility. Baseline: Refer to above LE MMT table Goal status: IN PROGRESS  6. Patient will report </= 44% on Modified Oswestry (MCID = 12%) to demonstrate improved functional ability with decreased pain interference. Baseline: 28 / 50 = 56.0 % Goal status: IN PROGRESS  7.  Patient to  report ability to perform ADLs, household, and work-related tasks without increased back pain. Baseline:  Goal status: IN PROGRESS   PLAN:  PT FREQUENCY: 2x/week  PT DURATION: 6 weeks  PLANNED INTERVENTIONS: 97164- PT Re-evaluation, 97110-Therapeutic exercises, 97530- Therapeutic activity, [id number]- Neuromuscular re-education, 97535- Self Care, [id number]- Manual therapy, [phone]- Aquatic Therapy, 97014- Electrical stimulation (unattended), [phone]- Electrical stimulation (manual), [id number]- Ultrasound, 02987- Traction (mechanical), [id number]- Ionotophoresis 4mg /ml Dexamethasone , Patient/Family education, Balance training, Taping, Dry Needling, Joint mobilization, Spinal mobilization, Cryotherapy, and Moist heat  PLAN FOR NEXT SESSION: Review initial HEP; progress postural and core stabilization/strengthening - update HEP accordingly;  MT +/- DN (pt wishes to try other options first) to address abnormal muscle tension; modalities PRN for pain management - possible trial of estim/TENS (provide info on home TENS unit options if benefit noted)   Elijah CHRISTELLA Hidden, PT 02/26/2023, 5:40 PM   PHYSICAL THERAPY DISCHARGE SUMMARY  Visits from Start of Care: 2  Current functional level related to goals / functional outcomes: Refer to above clinical impression and goal assessment for status as of last visit on 02/26/2023. Patient cancelled her next 3 visits and no showed for the last 2 visits.  She has not returned to PT in >30 days, therefore will proceed with discharge from PT for this episode.     Remaining deficits: As above.   Education / Equipment: HEP   Patient agrees to discharge. Patient goals were not met. Patient is being discharged due to not returning since the last visit.   Elijah EMERSON Hidden, PT 07/10/2023, 9:20 AM  [facility] [address]  [address] [place], [place], [address] Phone: [phone]   Fax:  [phone]

## 2023-02-27 ENCOUNTER — Ambulatory Visit: Payer: Medicaid Other | Admitting: Medical

## 2023-02-28 ENCOUNTER — Ambulatory Visit: Payer: Medicaid Other

## 2023-02-28 ENCOUNTER — Ambulatory Visit (HOSPITAL_BASED_OUTPATIENT_CLINIC_OR_DEPARTMENT_OTHER): Payer: Self-pay | Admitting: Family

## 2023-02-28 ENCOUNTER — Telehealth: Payer: Self-pay | Admitting: Emergency Medicine

## 2023-02-28 DIAGNOSIS — F419 Anxiety disorder, unspecified: Secondary | ICD-10-CM

## 2023-02-28 DIAGNOSIS — F32A Depression, unspecified: Secondary | ICD-10-CM | POA: Diagnosis not present

## 2023-02-28 NOTE — Telephone Encounter (Signed)
Looks like referral was just placed for behavioral health. Counseling discussed in last note. Please advise.

## 2023-02-28 NOTE — Progress Notes (Signed)
 Psychiatric Initial Adult Assessment   Patient Identification: Nancy May MRN:  [id number] Date of Evaluation:  02/28/2023 Referral Source: Primary Care provider  Chief Complaint: "Would like to speak to a therapist,so I can get a letter for a service animal to help with my anxiety." Visit Diagnosis:    ICD-10-CM   1. Anxiety and depression  F41.9    F32.A       History of Present Illness:  Nancy May is a 28 year old African-American female.  She was seen and evaluated via care agility virtual platform.  Reports she was referred by her primary care provider due to multiple psychosocial stressors related to her health and her mood.  Reports increased anxiety related to upcoming surgery states she was diagnosed with a Pilonidal cyst,coupled with being fired from multiple jobs in the past few months. "  I do not respect authority."  She reports she was followed by [facility] for therapy services however, due to insurance she stated that she was unable to continue services.  Denied that she has been prescribed psychotropic medications in the past.    Per patient registration she describes her main symptoms to include anger, symptoms of worry and anxiety.  Reports symptoms have been affecting her mental health and relationships.  Patient reports appetite fluctuation due to being busy throughout the day.  Reports racing thoughts.   Casimer Bilis denied previous inpatient admissions.  Denied illicit drug use or substance abuse history.  Documented first drink at age 52 last drink was a few days ago denied previous residential treatment facilities.  Reports history of family mental illness: Brother: Recent suicide attempt, depression.  General statement most of my cousins on both sides of the family  PHQ-9 15 GAD-7 18  Depressive disorder Generalized anxiety disorder -Discussed consideration on the need to initiate antidepressant SSRI i.e. Zoloft, Prozac and/or Paxil -Patient declined  states she is seeking services for talk therapy, and would like a letter to be provided for service animal   Nancy May is laying in bed.she is alert/oriented x 3; calm/cooperative; and mood congruent with affect.  Patient is speaking in a clear tone at moderate volume, and normal pace; with good eye contact.    Her thought process is coherent and relevant; There is no indication that she is currently responding to internal/external stimuli or experiencing delusional thought content.  Patient denies suicidal/self-harm/homicidal ideation, psychosis, and paranoia.  Patient has remained calm throughout assessment and has answered questions appropriately.   Associated Signs/Symptoms: Depression Symptoms:  depressed mood, difficulty concentrating, anxiety, (Hypo) Manic Symptoms:  Distractibility, Irritable Mood, Anxiety Symptoms:  Excessive Worry, Psychotic Symptoms:  Hallucinations: None PTSD Symptoms: NA  Past Psychiatric History: Reports struggling with depression and anxiety in the past was [facility]  Previous Psychotropic Medications: No   Substance Abuse History in the last 12 months:  Yes.    Consequences of Substance Abuse: NA  Past Medical History:  Past Medical History:  Diagnosis Date   Allergic rhinitis 05/01/2012   Bacterial vaginosis    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 02/07/20 Retreated 3/24 based on similar presenting symptoms, however benign exam   Lower urinary tract infectious disease 05/01/2012   Nexplanon in place 04/21/2020   Formatting of this note might be different from the original. Nexplanon placed 04/21/2020   Pilonidal sinus 11/28/2018   Possible exposure to STD 09/03/2019   Recurrent vaginitis 04/15/2020   Formatting of  this note might be different from the original. Multiple bouts of bacterial vaginosis and feels she cannot clear infection. Wet prep with clue cells,  negative whiff 04/08/20 Treat empirically for BV and metrogel for suppression to follow (she was prescribed in the past but did not use for more than 2 months, will plan for 4 - 6 months maintenance of remission after initial treatment).    Past Surgical History:  Procedure Laterality Date   NO PAST SURGERIES      Family Psychiatric History: Reports history of family mental illness: Brother: Recent suicide attempt, depression.  General statement most of my cousins on both sides of the family  Family History:  Family History  Problem Relation Age of Onset   Drug abuse Mother    Early death Mother    Heart failure Mother    Drug abuse Father    Heart disease Father    Stroke Maternal Grandmother    Hypertension Maternal Grandmother    Hyperlipidemia Maternal Grandmother    Arthritis Maternal Grandmother     Social History:   Social History   Socioeconomic History   Marital status: Single    Spouse name: Not on file   Number of children: Not on file   Years of education: Not on file   Highest education level: Not on file  Occupational History   Not on file  Tobacco Use   Smoking status: Never   Smokeless tobacco: Never  Vaping Use   Vaping status: Never Used  Substance and Sexual Activity   Alcohol use: Yes    Alcohol/week: 3.0 standard drinks of alcohol    Types: 1 Glasses of wine, 2 Shots of liquor per week   Drug use: Yes    Types: Marijuana   Sexual activity: Yes    Birth control/protection: None, Implant  Other Topics Concern   Not on file  Social History Narrative   Not on file   Social Drivers of Health   Financial Resource Strain: Not on file  Food Insecurity: Not on file  Transportation Needs: Not on file  Physical Activity: Not on file  Stress: Not on file  Social Connections: Not on file    Additional Social History: N/a  Allergies:  No Known Allergies  Metabolic Disorder Labs: Lab Results  Component Value Date   HGBA1C 5.5 08/24/2021   No  results found for: "PROLACTIN" Lab Results  Component Value Date   CHOL 193 08/29/2021   TRIG 81 08/29/2021   HDL 51 08/29/2021   CHOLHDL 3.8 08/29/2021   LDLCALC 127 (H) 08/29/2021   No results found for: "TSH"  Therapeutic Level Labs: No results found for: "LITHIUM" No results found for: "CBMZ" No results found for: "VALPROATE"  Current Medications: Current Outpatient Medications  Medication Sig Dispense Refill   ibuprofen (ADVIL) 600 MG tablet Take 1 tablet (600 mg total) by mouth every 6 (six) hours as needed. 30 tablet 0   ketorolac (TORADOL) 10 MG tablet Take 10 mg by mouth every 6 (six) hours as needed for severe pain (pain score 7-10).     meloxicam (MOBIC) 7.5 MG tablet Take 7.5 mg by mouth daily.     metroNIDAZOLE (METROGEL) 0.75 % vaginal gel Place 1 Applicatorful vaginally at bedtime. Apply one applicatorful to vagina at bedtime for 10 days, then twice a week for 6 months. 70 g 5   sulfamethoxazole-trimethoprim (BACTRIM DS) 800-160 MG tablet Take 1 tablet by mouth 2 (two) times daily for 7 days. 14  tablet 0   traMADol (ULTRAM) 50 MG tablet Take 50 mg by mouth every 6 (six) hours as needed for severe pain (pain score 7-10).     No current facility-administered medications for this visit.    Musculoskeletal: Care agility  Psychiatric Specialty Exam: Review of Systems  Psychiatric/Behavioral:  Positive for agitation and decreased concentration. Negative for suicidal ideas. The patient is nervous/anxious.   All other systems reviewed and are negative.   There were no vitals taken for this visit.There is no height or weight on file to calculate BMI.  General Appearance: Casual  Eye Contact:  Good  Speech:  Clear and Coherent  Volume:  Normal  Mood:  Anxious and Depressed  Affect:  Congruent  Thought Process:  Coherent  Orientation:  Full (Time, Place, and Person)  Thought Content:  Logical  Suicidal Thoughts:  No  Homicidal Thoughts:  No  Memory:  Immediate;    Good Recent;   Good  Judgement:  Good  Insight:  Good  Psychomotor Activity:  Normal  Concentration:  Concentration: Good  Recall:  Good  Fund of Knowledge:Good  Language: Good  Akathisia:  No  Handed:  Right  AIMS (if indicated):  not done  Assets:  Communication Skills Desire for Improvement Resilience Social Support  ADL's:  Intact  Cognition: WNL  Sleep:  Fair   Screenings: GAD-7    Garment/textile technologist Visit from 02/21/2023 in [facility] Office Visit from 11/28/2018 in [facility] [place]  Total GAD-7 Score 18 0      PHQ2-9    Flowsheet Row Office Visit from 02/21/2023 in [facility] at [facility] Office Visit from 01/30/2023 in [facility] at [facility] Office Visit from 11/21/2021 in [facility] at [facility] Office Visit from 11/28/2018 in [facility] [place] [facility] [place]. [facility]  PHQ-2 Total Score 2 0 0 0  PHQ-9 Total Score 9 -- -- 0      Flowsheet Row ED from 02/23/2023 in [facility] at [facility] ED from 01/28/2023 in [facility] Emergency Department at [facility] ED from 02/06/2020 in [facility] Department at [facility]  C-SSRS RISK CATEGORY No Risk No Risk No Risk       Assessment and Plan: Noella Kipnis 28 year old female presents to establish care.  She reports her main concerns is seeking therapy services.  Reports she is interested in services related to therapy animal.  No complaints of suicidal or homicidal ideations.  No auditory or visual hallucinations noted.  Denied that she is ever taking any psychotropic medications in the past.  She denied that she is interested at this time and initiating anything for her anxiety/depression symptoms.  Depression and  anxiety: -Patient may benefit from SSRI and/or SNRI however declined at this time. -Will defer to front desk for additional outpatient services for therapy services.  Collaboration of Care: Other follow-up with therapy services  Patient/Guardian was advised Release of Information must be obtained prior to any record release in order to collaborate their care with an outside provider. Patient/Guardian was advised if they have not already done so to contact the registration department to sign all necessary forms in order for [place] to release information regarding their care.   Consent: Patient/Guardian  gives verbal consent for treatment and assignment of benefits for services provided during this visit. Patient/Guardian expressed understanding and agreed to proceed.   Oneta Rack, NP 2/12/20258:25 AM

## 2023-02-28 NOTE — Telephone Encounter (Signed)
Copied from CRM [id number]. Topic: Referral - Question >> Feb 28, 2023  9:39 AM [name] wrote: Reason for CRM: Patient called in stating that she has a referral for a Tourist information centre manager to discuss her anxiety and depression, but at her appointment this morning, she found out that it was a Med Management therapy appointment when that was not who she thought she was getting referred too. Patient would like more clarification on setting up an appointment for a Behavioral Therapist. Callback number for patient is [phone].

## 2023-03-01 NOTE — Telephone Encounter (Signed)
Called patient to let her know she could reach back out to the people who scheduled her for psychiatrist to talk about counseling or we would be sending her a list to check with insurance coverage on mychart.

## 2023-03-07 ENCOUNTER — Ambulatory Visit: Payer: Medicaid Other

## 2023-03-12 ENCOUNTER — Ambulatory Visit: Payer: Medicaid Other | Admitting: Physical Therapy

## 2023-03-14 ENCOUNTER — Ambulatory Visit: Payer: Medicaid Other

## 2023-03-17 DIAGNOSIS — Z419 Encounter for procedure for purposes other than remedying health state, unspecified: Secondary | ICD-10-CM | POA: Diagnosis not present

## 2023-03-19 ENCOUNTER — Ambulatory Visit: Payer: Medicaid Other | Attending: Family Medicine | Admitting: Physical Therapy

## 2023-03-19 DIAGNOSIS — L0591 Pilonidal cyst without abscess: Secondary | ICD-10-CM | POA: Diagnosis not present

## 2023-03-28 ENCOUNTER — Encounter (HOSPITAL_COMMUNITY): Payer: Self-pay | Admitting: General Surgery

## 2023-03-28 ENCOUNTER — Other Ambulatory Visit: Payer: Self-pay

## 2023-03-28 ENCOUNTER — Ambulatory Visit: Payer: Self-pay | Admitting: General Surgery

## 2023-03-28 NOTE — Progress Notes (Signed)
 PCP - Hyman Hopes, NP Cardiologist - Dr. Gypsy Balsam  PPM/ICD - denies   Chest x-ray - 10/26/22 EKG - 08/29/21 (n/a) Stress Test - denies ECHO - 11/01/21 Cardiac Cath - denies  CPAP - n/a  DM- denies  ASA/Blood Thinner Instructions: n/a   ERAS Protcol - clears until 0700  COVID TEST- n/a  Anesthesia review: yes, pt has seen Dr. Bing Matter  Patient verbally denies any shortness of breath, fever, cough and chest pain during phone call       Questions were answered. Patient verbalized understanding of instructions.

## 2023-03-28 NOTE — Pre-Procedure Instructions (Signed)
-------------    SDW INSTRUCTIONS given:  Your procedure is scheduled on 3/13.  Report to [facility] Main Entrance "A" at 07:30 A.M., and check in at the Admitting office.  Any questions or running late day of surgery: call [phone]    Remember:  Do not eat after midnight the night before your surgery  You may drink clear liquids until 07:00 AM the morning of your surgery.   Clear liquids allowed are: Water, Non-Citrus Juices (without pulp), Carbonated Beverages, Clear Tea, Black Coffee Only, and Gatorade    Take these medicines the morning of surgery with A SIP OF WATER  Augmentin      May take these medicines IF NEEDED: tramadol   toradol   As of today, STOP taking any Aspirin (unless otherwise instructed by your surgeon) Aleve, Naproxen, Ibuprofen, Motrin, Advil, Goody's, BC's, all herbal medications, fish oil, and all vitamins.   Do NOT Smoke (Tobacco/Vaping) 24 hours prior to your procedure  If you use a CPAP at night, you may bring all equipment for your overnight stay.     You will be asked to remove any contacts, glasses, piercing's, hearing aid's, dentures/partials prior to surgery. Please bring cases for these items if needed.     Patients discharged the day of surgery will not be allowed to drive home, and someone needs to stay with them for 24 hours.  SURGICAL WAITING ROOM VISITATION Patients may have no more than 2 support people in the waiting area - these visitors may rotate.   Pre-op nurse will coordinate an appropriate time for 1 ADULT support person, who may not rotate, to accompany patient in pre-op.  Children under the age of 51 must have an adult with them who is not the patient and must remain in the main waiting area with an adult.  If the patient needs to stay at the hospital during part of their recovery, the visitor guidelines for inpatient rooms apply.  Please refer to the [facility] website for the visitor guidelines for any additional  information.   Special instructions:   [place]- Preparing For Surgery   Please follow these instructions carefully.   Shower the NIGHT BEFORE SURGERY and the MORNING OF SURGERY with DIAL Soap.   Pat yourself dry with a CLEAN TOWEL.  Wear CLEAN PAJAMAS to bed the night before surgery  Place CLEAN SHEETS on your bed the night of your first shower and DO NOT SLEEP WITH PETS.   Additional instructions for the day of surgery: DO NOT APPLY any lotions, deodorants, cologne, or perfumes.   Do not wear jewelry or makeup Do not wear nail polish, gel polish, artificial nails, or any other type of covering on natural nails (fingers and toes) Do not bring valuables to the hospital. [facility] is not responsible for valuables/personal belongings. Put on clean/comfortable clothes.  Please brush your teeth.  Ask your nurse before applying any prescription medications to the skin.

## 2023-03-29 ENCOUNTER — Ambulatory Visit (HOSPITAL_COMMUNITY): Payer: Self-pay | Admitting: Anesthesiology

## 2023-03-29 ENCOUNTER — Ambulatory Visit (HOSPITAL_COMMUNITY)
Admission: RE | Admit: 2023-03-29 | Discharge: 2023-03-29 | Disposition: A | Discharge status: Home / Self Care | Attending: General Surgery | Admitting: General Surgery

## 2023-03-29 ENCOUNTER — Encounter (HOSPITAL_COMMUNITY)
Admission: RE | Disposition: A | Discharge status: Home / Self Care | Payer: Self-pay | Source: Home / Self Care | Attending: General Surgery

## 2023-03-29 ENCOUNTER — Other Ambulatory Visit: Payer: Self-pay

## 2023-03-29 ENCOUNTER — Ambulatory Visit (HOSPITAL_BASED_OUTPATIENT_CLINIC_OR_DEPARTMENT_OTHER): Payer: Self-pay | Admitting: Anesthesiology

## 2023-03-29 ENCOUNTER — Encounter (HOSPITAL_COMMUNITY): Payer: Self-pay | Admitting: General Surgery

## 2023-03-29 DIAGNOSIS — F32A Depression, unspecified: Secondary | ICD-10-CM | POA: Insufficient documentation

## 2023-03-29 DIAGNOSIS — L0591 Pilonidal cyst without abscess: Secondary | ICD-10-CM | POA: Diagnosis not present

## 2023-03-29 DIAGNOSIS — E669 Obesity, unspecified: Secondary | ICD-10-CM | POA: Diagnosis not present

## 2023-03-29 DIAGNOSIS — L0501 Pilonidal cyst with abscess: Secondary | ICD-10-CM | POA: Diagnosis not present

## 2023-03-29 DIAGNOSIS — Z6832 Body mass index (BMI) 32.0-32.9, adult: Secondary | ICD-10-CM | POA: Diagnosis not present

## 2023-03-29 DIAGNOSIS — F419 Anxiety disorder, unspecified: Secondary | ICD-10-CM | POA: Diagnosis not present

## 2023-03-29 HISTORY — PX: PILONIDAL CYST DRAINAGE: SHX743

## 2023-03-29 HISTORY — DX: Anxiety disorder, unspecified: F41.9

## 2023-03-29 LAB — CBC
HCT: 43.3 % (ref 36.0–46.0)
Hemoglobin: 14.8 g/dL (ref 12.0–15.0)
MCH: 32.4 pg (ref 26.0–34.0)
MCHC: 34.2 g/dL (ref 30.0–36.0)
MCV: 94.7 fL (ref 80.0–100.0)
Platelets: 245 10*3/uL (ref 150–400)
RBC: 4.57 MIL/uL (ref 3.87–5.11)
RDW: 13.5 % (ref 11.5–15.5)
WBC: 10.2 10*3/uL (ref 4.0–10.5)
nRBC: 0 % (ref 0.0–0.2)

## 2023-03-29 LAB — POCT PREGNANCY, URINE: Preg Test, Ur: NEGATIVE

## 2023-03-29 SURGERY — EXCISION, PILONIDAL CYST
Anesthesia: General | Site: Rectum

## 2023-03-29 MED ORDER — ROCURONIUM BROMIDE 10 MG/ML (PF) SYRINGE
PREFILLED_SYRINGE | INTRAVENOUS | Status: AC
  Filled 2023-03-29: qty 10

## 2023-03-29 MED ORDER — SUGAMMADEX SODIUM 200 MG/2ML IV SOLN
INTRAVENOUS | Status: DC | PRN
  Administered 2023-03-29: 200 mg via INTRAVENOUS

## 2023-03-29 MED ORDER — LIDOCAINE 2% (20 MG/ML) 5 ML SYRINGE
INTRAMUSCULAR | Status: DC | PRN
  Administered 2023-03-29: 100 mg via INTRAVENOUS

## 2023-03-29 MED ORDER — FENTANYL CITRATE (PF) 250 MCG/5ML IJ SOLN
INTRAMUSCULAR | Status: DC | PRN
  Administered 2023-03-29: 50 ug via INTRAVENOUS

## 2023-03-29 MED ORDER — ORAL CARE MOUTH RINSE: 15.0000 mL | Freq: Once | OROMUCOSAL | Status: AC

## 2023-03-29 MED ORDER — OXYCODONE HCL 5 MG PO TABS: 2.5000 mg | ORAL_TABLET | ORAL | 0 refills | Status: AC | PRN

## 2023-03-29 MED ORDER — FENTANYL CITRATE (PF) 100 MCG/2ML IJ SOLN
INTRAMUSCULAR | Status: AC
  Filled 2023-03-29: qty 2

## 2023-03-29 MED ORDER — SUGAMMADEX SODIUM 200 MG/2ML IV SOLN
INTRAVENOUS | Status: AC
  Filled 2023-03-29: qty 4

## 2023-03-29 MED ORDER — OXYCODONE HCL 5 MG PO TABS
ORAL_TABLET | ORAL | Status: AC
  Filled 2023-03-29: qty 1

## 2023-03-29 MED ORDER — SODIUM CHLORIDE 0.9 % IV SOLN: 12.5000 mg | INTRAVENOUS | Status: DC | PRN

## 2023-03-29 MED ORDER — 0.9 % SODIUM CHLORIDE (POUR BTL) OPTIME
TOPICAL | Status: DC | PRN
  Administered 2023-03-29: 1000 mL

## 2023-03-29 MED ORDER — ONDANSETRON HCL 4 MG/2ML IJ SOLN
INTRAMUSCULAR | Status: AC
  Filled 2023-03-29: qty 2

## 2023-03-29 MED ORDER — DEXAMETHASONE SODIUM PHOSPHATE 10 MG/ML IJ SOLN
INTRAMUSCULAR | Status: DC | PRN
  Administered 2023-03-29: 10 mg via INTRAVENOUS

## 2023-03-29 MED ORDER — PROPOFOL 10 MG/ML IV BOLUS
INTRAVENOUS | Status: AC
  Filled 2023-03-29: qty 20

## 2023-03-29 MED ORDER — MIDAZOLAM HCL 2 MG/2ML IJ SOLN
INTRAMUSCULAR | Status: DC | PRN
  Administered 2023-03-29: 2 mg via INTRAVENOUS

## 2023-03-29 MED ORDER — FENTANYL CITRATE (PF) 100 MCG/2ML IJ SOLN
25.0000 ug | INTRAMUSCULAR | Status: DC | PRN
  Administered 2023-03-29: 25 ug via INTRAVENOUS

## 2023-03-29 MED ORDER — HEPARIN SODIUM (PORCINE) 5000 UNIT/ML IJ SOLN
5000.0000 [IU] | Freq: Once | INTRAMUSCULAR | Status: AC
  Administered 2023-03-29: 5000 [IU] via SUBCUTANEOUS
  Filled 2023-03-29: qty 1

## 2023-03-29 MED ORDER — ONDANSETRON HCL 4 MG/2ML IJ SOLN
INTRAMUSCULAR | Status: DC | PRN
  Administered 2023-03-29: 4 mg via INTRAVENOUS

## 2023-03-29 MED ORDER — ACETAMINOPHEN 500 MG PO TABS: 1000.0000 mg | ORAL_TABLET | Freq: Once | ORAL | Status: DC

## 2023-03-29 MED ORDER — FENTANYL CITRATE (PF) 250 MCG/5ML IJ SOLN
INTRAMUSCULAR | Status: AC
  Filled 2023-03-29: qty 5

## 2023-03-29 MED ORDER — CELECOXIB 200 MG PO CAPS
200.0000 mg | ORAL_CAPSULE | Freq: Once | ORAL | Status: AC
  Administered 2023-03-29: 200 mg via ORAL
  Filled 2023-03-29: qty 1

## 2023-03-29 MED ORDER — ROCURONIUM BROMIDE 10 MG/ML (PF) SYRINGE
PREFILLED_SYRINGE | INTRAVENOUS | Status: DC | PRN
  Administered 2023-03-29: 50 mg via INTRAVENOUS

## 2023-03-29 MED ORDER — LIDOCAINE 2% (20 MG/ML) 5 ML SYRINGE
INTRAMUSCULAR | Status: AC
Start: 2023-03-29 — End: ?
  Filled 2023-03-29: qty 5

## 2023-03-29 MED ORDER — CHLORHEXIDINE GLUCONATE CLOTH 2 % EX PADS: 6.0000 | MEDICATED_PAD | Freq: Once | CUTANEOUS | Status: DC

## 2023-03-29 MED ORDER — PROPOFOL 10 MG/ML IV BOLUS
INTRAVENOUS | Status: DC | PRN
  Administered 2023-03-29: 200 mg via INTRAVENOUS

## 2023-03-29 MED ORDER — MIDAZOLAM HCL 2 MG/2ML IJ SOLN
INTRAMUSCULAR | Status: AC
  Filled 2023-03-29: qty 2

## 2023-03-29 MED ORDER — STERILE WATER FOR IRRIGATION IR SOLN
Status: DC | PRN
  Administered 2023-03-29: 1000 mL

## 2023-03-29 MED ORDER — DEXAMETHASONE SODIUM PHOSPHATE 10 MG/ML IJ SOLN
INTRAMUSCULAR | Status: AC
  Filled 2023-03-29: qty 1

## 2023-03-29 MED ORDER — CHLORHEXIDINE GLUCONATE 0.12 % MT SOLN: 15.0000 mL | Freq: Once | OROMUCOSAL | Status: AC

## 2023-03-29 MED ORDER — METRONIDAZOLE 500 MG/100ML IV SOLN: 500.0000 mg | INTRAVENOUS | Status: DC

## 2023-03-29 MED ORDER — ACETAMINOPHEN 500 MG PO TABS
1000.0000 mg | ORAL_TABLET | ORAL | Status: AC
  Administered 2023-03-29: 1000 mg via ORAL
  Filled 2023-03-29: qty 2

## 2023-03-29 MED ORDER — AMISULPRIDE (ANTIEMETIC) 5 MG/2ML IV SOLN: 10.0000 mg | Freq: Once | INTRAVENOUS | Status: DC | PRN

## 2023-03-29 MED ORDER — CEFAZOLIN SODIUM-DEXTROSE 2-4 GM/100ML-% IV SOLN
2.0000 g | INTRAVENOUS | Status: AC
Start: 2023-03-29 — End: 2023-03-29
  Administered 2023-03-29: 2 g via INTRAVENOUS
  Filled 2023-03-29: qty 100

## 2023-03-29 MED ORDER — LACTATED RINGERS IV SOLN: INTRAVENOUS | Status: DC

## 2023-03-29 MED ORDER — OXYCODONE HCL 5 MG PO TABS
5.0000 mg | ORAL_TABLET | Freq: Once | ORAL | Status: AC | PRN
  Administered 2023-03-29: 5 mg via ORAL

## 2023-03-29 MED ORDER — OXYCODONE HCL 5 MG/5ML PO SOLN: 5.0000 mg | Freq: Once | ORAL | Status: AC | PRN

## 2023-03-29 MED ORDER — GABAPENTIN 300 MG PO CAPS
300.0000 mg | ORAL_CAPSULE | ORAL | Status: AC
Start: 2023-03-29 — End: 2023-03-29
  Administered 2023-03-29: 300 mg via ORAL
  Filled 2023-03-29: qty 1

## 2023-03-29 MED ORDER — CHLORHEXIDINE GLUCONATE 0.12 % MT SOLN
OROMUCOSAL | Status: AC
  Administered 2023-03-29: 15 mL via OROMUCOSAL
  Filled 2023-03-29: qty 15

## 2023-03-29 SURGICAL SUPPLY — 27 items
BAG COUNTER SPONGE SURGICOUNT (BAG) IMPLANT
BLADE CLIPPER SURG (BLADE) IMPLANT
CANISTER SUCT 3000ML PPV (MISCELLANEOUS) ×1 IMPLANT
COVER SURGICAL LIGHT HANDLE (MISCELLANEOUS) ×1 IMPLANT
DRAPE LAPAROTOMY T 102X78X121 (DRAPES) ×1 IMPLANT
ELECT REM PT RETURN 9FT ADLT (ELECTROSURGICAL) ×1 IMPLANT
ELECTRODE REM PT RTRN 9FT ADLT (ELECTROSURGICAL) ×1 IMPLANT
GAUZE PACKING 1INX5YD STRL (GAUZE/BANDAGES/DRESSINGS) IMPLANT
GAUZE PAD ABD 8X10 STRL (GAUZE/BANDAGES/DRESSINGS) IMPLANT
GAUZE SPONGE 4X4 12PLY STRL (GAUZE/BANDAGES/DRESSINGS) ×1 IMPLANT
GLOVE BIOGEL PI MICRO STRL 6 (GLOVE) ×1 IMPLANT
GLOVE INDICATOR 6.5 STRL GRN (GLOVE) ×1 IMPLANT
GOWN STRL REUS W/ TWL LRG LVL3 (GOWN DISPOSABLE) ×2 IMPLANT
KIT BASIN OR (CUSTOM PROCEDURE TRAY) ×1 IMPLANT
KIT TURNOVER KIT B (KITS) ×1 IMPLANT
NS IRRIG 1000ML POUR BTL (IV SOLUTION) ×1 IMPLANT
PACK GENERAL/GYN (CUSTOM PROCEDURE TRAY) ×1 IMPLANT
PAD ARMBOARD 7.5X6 YLW CONV (MISCELLANEOUS) ×2 IMPLANT
PENCIL SMOKE EVACUATOR (MISCELLANEOUS) ×1 IMPLANT
PUNCH BIOPSY 4MM (MISCELLANEOUS) IMPLANT
PUNCH BIOPSY DERMAL 6MM STRL (MISCELLANEOUS) IMPLANT
PUNCH BIOPSY DISP 4 (MISCELLANEOUS) IMPLANT
SPECIMEN JAR SMALL (MISCELLANEOUS) ×1 IMPLANT
TAPE CLOTH 4X10 WHT NS (GAUZE/BANDAGES/DRESSINGS) IMPLANT
TOWEL GREEN STERILE (TOWEL DISPOSABLE) ×1 IMPLANT
TOWEL GREEN STERILE FF (TOWEL DISPOSABLE) ×1 IMPLANT
UNDERPAD 30X36 HEAVY ABSORB (UNDERPADS AND DIAPERS) ×1 IMPLANT

## 2023-03-29 NOTE — Anesthesia Procedure Notes (Signed)
 Procedure Name: Intubation Date/Time: 03/29/2023 9:50 AM  Performed by: Susy Manor, CRNAPre-anesthesia Checklist: Patient identified, Emergency Drugs available, Suction available and Patient being monitored Patient Re-evaluated:Patient Re-evaluated prior to induction Oxygen Delivery Method: Circle System Utilized Preoxygenation: Pre-oxygenation with 100% oxygen Induction Type: IV induction Ventilation: Mask ventilation without difficulty Laryngoscope Size: Mac and 3 Grade View: Grade I Tube type: Oral Tube size: 7.0 mm Number of attempts: 1 Airway Equipment and Method: Stylet and Oral airway Placement Confirmation: ETT inserted through vocal cords under direct vision, positive ETCO2 and breath sounds checked- equal and bilateral Secured at: 21 cm Tube secured with: Tape Dental Injury: Teeth and Oropharynx as per pre-operative assessment

## 2023-03-29 NOTE — H&P (Signed)
 HPI  Nancy May is an 28 y.o. female with history of pilonidal cyst with abscess  Patient noted an abscess at the beginning of January in the pilonidal region associated with erythema and swelling. She was seen by her PCP who sent referral to our clinic. She had been using warm compresses but provided no relief. Prior to appointment patient presented to ED on 2/7 due to worsening of symptoms/pain. At that time, EDP performed I&D of region with moderate amount of purulent drainage. She was prescribed a course of bactrim and discharged from ED. She was seen again on 2/10 in UC and she states the area was further probed/incised/drained and this was a traumatic experience for her.  Since these visits patient states pain has resolved but still notes small area of firmness without drainage or erythema and is concerned that it will transform into painful abscess again. She was still taking Bactrim because she was incorrectly taking once per day for the first week or two.   Updated HPI I prescribed patient Augmentin when seen in clinic. Today she states that she feels the area getting larger and more painful.   10 point review of systems is negative except as listed above in HPI.  Objective  Past Medical History: Past Medical History:  Diagnosis Date   Allergic rhinitis 05/01/2012   Anxiety    Bacterial vaginosis    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 02/07/20 Retreated 3/24 based on similar presenting symptoms, however benign exam   Lower urinary tract infectious disease 05/01/2012   Nexplanon in place 04/21/2020   Formatting of this note might be different from the original. Nexplanon placed 04/21/2020   Pilonidal sinus 11/28/2018   Possible exposure to STD 09/03/2019   Recurrent vaginitis 04/15/2020   Formatting of this note might be different from the original. Multiple bouts of bacterial vaginosis and feels she cannot clear  infection. Wet prep with clue cells, negative whiff 04/08/20 Treat empirically for BV and metrogel for suppression to follow (she was prescribed in the past but did not use for more than 2 months, will plan for 4 - 6 months maintenance of remission after initial treatment).    Past Surgical History: Past Surgical History:  Procedure Laterality Date   WISDOM TOOTH EXTRACTION      Family History:  Family History  Problem Relation Age of Onset   Drug abuse Mother    Early death Mother    Heart failure Mother    Drug abuse Father    Heart disease Father    Stroke Maternal Grandmother    Hypertension Maternal Grandmother    Hyperlipidemia Maternal Grandmother    Arthritis Maternal Grandmother     Social History:  reports that she has never smoked. She has never used smokeless tobacco. She reports that she does not currently use alcohol. She reports current drug use. Frequency: 7.00 times per week. Drug: Marijuana.  Allergies: No Known Allergies  Medications: I have reviewed the patient's current medications.  Labs: I have personally reviewed all labs for the past 24h  Imaging: I have personally reviewed and interpreted all imaging for the past 24h and agree with the radiologist's impression.  No results found.   Physical Exam Height 5\' 7"  (1.702 m), weight 95.3 kg, last menstrual period 02/28/2023. General: No acute distress, well appearing HEENT: PERRL, hearing grossly normal, mucous membranes moist CV: Regular rate and rhythm Pulm: Normal work  of breathing on room air Abd: Soft, nontender, nondistended GU/Rectal: Small2x1cm area of firmness, induration, mild erythema, tenderness to palpation. Several pits noted in gluteal fold. Extremities: Warm and well perfused Neuro: A&O x4, no focal neurologic deficits Psych: Appropriate mood and effect     Assessment   Nancy May is an 28 y.o. female with pilonidal cyst with abscess.  Plan  - Proceed to OR for  trephination of pilonidal cyst and incision and drainage of abscess - We discussed risks of surgery including bleeding, infection, recurrence, possible need for larger operation if recurs. Patient voiced understanding and would like to proceed.   Donata Duff, MD [facility] Surgery

## 2023-03-29 NOTE — Transfer of Care (Signed)
 Immediate Anesthesia Transfer of Care Note  Patient: Nancy May  Procedure(s) Performed: EXCISION, PILONIDAL CYST (Rectum)  Patient Location: PACU  Anesthesia Type:General  Level of Consciousness: awake, alert , and oriented  Airway & Oxygen Therapy: Patient Spontanous Breathing and Patient connected to nasal cannula oxygen  Post-op Assessment: Report given to RN and Post -op Vital signs reviewed and stable  Post vital signs: Reviewed and stable  Last Vitals:  Vitals Value Taken Time  BP 117/73 03/29/23 1036  Temp    Pulse 108 03/29/23 1039  Resp 18 03/29/23 1039  SpO2 100 % 03/29/23 1039  Vitals shown include unfiled device data.  Last Pain:  Vitals:   03/29/23 0825  PainSc: 4          Complications: No notable events documented.

## 2023-03-29 NOTE — Anesthesia Preprocedure Evaluation (Addendum)
 Anesthesia Evaluation  Patient identified by MRN, date of birth, ID band Patient awake    Reviewed: Allergy & Precautions, NPO status , Patient's Chart, lab work & pertinent test results  History of Anesthesia Complications Negative for: history of anesthetic complications  Airway Mallampati: II  TM Distance: >3 FB Neck ROM: Full    Dental  (+) Dental Advisory Given   Pulmonary Current Smoker and Patient abstained from smoking.   Pulmonary exam normal        Cardiovascular negative cardio ROS Normal cardiovascular exam     Neuro/Psych  PSYCHIATRIC DISORDERS Anxiety Depression    negative neurological ROS     GI/Hepatic negative GI ROS,,,(+)     substance abuse  marijuana use  Endo/Other   Obesity   Renal/GU negative Renal ROS     Musculoskeletal negative musculoskeletal ROS (+)    Abdominal   Peds  Hematology negative hematology ROS (+)   Anesthesia Other Findings   Reproductive/Obstetrics                             Anesthesia Physical Anesthesia Plan  ASA: 2  Anesthesia Plan: General   Post-op Pain Management: Tylenol PO (pre-op)* and Celebrex PO (pre-op)*   Induction: Intravenous  PONV Risk Score and Plan: 3 and Treatment may vary due to age or medical condition, Ondansetron, Dexamethasone and Midazolam  Airway Management Planned: Oral ETT  Additional Equipment: None  Intra-op Plan:   Post-operative Plan: Extubation in OR  Informed Consent: I have reviewed the patients History and Physical, chart, labs and discussed the procedure including the risks, benefits and alternatives for the proposed anesthesia with the patient or authorized representative who has indicated his/her understanding and acceptance.     Dental advisory given  Plan Discussed with: CRNA and Anesthesiologist  Anesthesia Plan Comments:        Anesthesia Quick Evaluation

## 2023-03-29 NOTE — OR Nursing (Signed)
 Patient discharged to home. Slightg delay waiting for sitze bath.

## 2023-03-29 NOTE — Op Note (Addendum)
 EXCISION, PILONIDAL CYST  Operative Note (CSN: [id number])  Service  Date of Surgery: 03/29/2023 Admit Date: 03/29/2023 Performing Service: General Surgeons and Role:    Azucena Cecil, Benetta Spar, MD - Primary  Op Note Pre-op Diagnosis: PILONIDAL CYST Post-op Diagnosis: Pilonidal cyst with abscess  Procedure(s): Excision of pilonidal cyst, incision and drainage of abscess  Findings: Several pits and sinus tract. Inferior cluster of 2 sinus tracts and superior cluster of 3 sinus tracts adjacent to abscess and communicating with abscess. Abscess cavity measuring 1x1.5cm  Anesthesia: General Estimated Blood Loss: 5cc  Complications: None Specimens: Pilonidal sinus and abscess drainage sent for culture  Brief history / Indications for Surgery: 28yo F with recurrent pilonidal cysts and abscesses.  Procedure Details  Prior to the procedure, the risks, benefits, complications, treatment options, and expected outcomes were discussed with the patient and/or family, including but not limited to, the risks of bleeding, infection, and recurrence.  Despite the risks, the patient has given informed consent for operative intervention.  The patient was taken to the Operating Room, identified as [place] and the procedure verified as trephination of pilonidal cyst/sinus and incision and drainage of abscess.  Identification pause was held and the above information confirmed.  The patient was placed in the supine position and General anesthesia was induced. Patient was then flipped into prone position. The bilateral buttocks and gluteal region was prepped with chloraprep and draped in the typical sterile fashion.  A formal preincision time out was performed.  Two inferior pilonidal sinus pits identified and probed with lacrimal duct probe. Some thickened sebaceous appearing material expressed from tracts. 6mm skin punch biopsy used to excised these tracts together.   Three superior pilonidal sinus  pits were identified adjacent to abscess and probed, found to communicate with abscess. Given close proximity, decision made to excise this en bloc with abscess incision. An elliptical incision was made overlying abscess as sinus tracts and skin excised. A tonsil was used to break up loculations. Purulent drainage sent for culture. Abscess cavity measured 1x1.5cm.  Abscess cavity was irrigated thoroughly and hemostasis ensured. Wound bed then packed with 1 inch iodoform gauze. Separate iodoform gauze placed into punch biopsy wound bed. Covered with 4x4, ABD and mesh underwear.  Instrument, sponge, and needle counts were correct at the conclusion of the case.   Lysle Rubens, MD Date: 03/29/2023  Time: 9:43 AM

## 2023-03-29 NOTE — Anesthesia Postprocedure Evaluation (Signed)
 Anesthesia Post Note  Patient: Nancy May  Procedure(s) Performed: EXCISION, PILONIDAL CYST (Rectum)     Patient location during evaluation: PACU Anesthesia Type: General Level of consciousness: awake and alert Pain management: pain level controlled Vital Signs Assessment: post-procedure vital signs reviewed and stable Respiratory status: spontaneous breathing, nonlabored ventilation and respiratory function stable Cardiovascular status: stable and blood pressure returned to baseline Anesthetic complications: no   No notable events documented.  Last Vitals:  Vitals:   03/29/23 1145 03/29/23 1220  BP:  124/80  Pulse: 84 74  Resp: 16   Temp:    SpO2: 100% 99%                  Beryle Lathe

## 2023-03-30 ENCOUNTER — Encounter (HOSPITAL_COMMUNITY): Payer: Self-pay | Admitting: General Surgery

## 2023-03-30 DIAGNOSIS — L0591 Pilonidal cyst without abscess: Secondary | ICD-10-CM | POA: Diagnosis not present

## 2023-03-30 DIAGNOSIS — Z9889 Other specified postprocedural states: Secondary | ICD-10-CM | POA: Diagnosis not present

## 2023-03-30 NOTE — Progress Notes (Signed)
 Wasted 75 mg fentanyl in proper disposal method, witnessed by Theotis Barrio RN

## 2023-04-03 ENCOUNTER — Encounter: Admitting: Family Medicine

## 2023-04-03 DIAGNOSIS — Z0000 Encounter for general adult medical examination without abnormal findings: Secondary | ICD-10-CM

## 2023-04-03 LAB — AEROBIC/ANAEROBIC CULTURE W GRAM STAIN (SURGICAL/DEEP WOUND): Gram Stain: NONE SEEN

## 2023-04-05 ENCOUNTER — Encounter (HOSPITAL_COMMUNITY): Payer: Self-pay | Admitting: Licensed Clinical Social Worker

## 2023-04-05 ENCOUNTER — Ambulatory Visit (HOSPITAL_COMMUNITY): Payer: Medicaid Other | Admitting: Licensed Clinical Social Worker

## 2023-04-05 DIAGNOSIS — F331 Major depressive disorder, recurrent, moderate: Secondary | ICD-10-CM

## 2023-04-05 DIAGNOSIS — F411 Generalized anxiety disorder: Secondary | ICD-10-CM | POA: Diagnosis not present

## 2023-04-05 LAB — AEROBIC/ANAEROBIC CULTURE W GRAM STAIN (SURGICAL/DEEP WOUND)
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
## 2023-04-09 ENCOUNTER — Encounter: Payer: Self-pay | Admitting: Family Medicine

## 2023-04-09 ENCOUNTER — Ambulatory Visit (INDEPENDENT_AMBULATORY_CARE_PROVIDER_SITE_OTHER): Admitting: Family Medicine

## 2023-04-09 VITALS — BP 124/86 | HR 90 | Temp 98.4°F | Ht 67.0 in | Wt 201.2 lb

## 2023-04-09 DIAGNOSIS — L089 Local infection of the skin and subcutaneous tissue, unspecified: Secondary | ICD-10-CM | POA: Diagnosis not present

## 2023-04-09 MED ORDER — DOXYCYCLINE HYCLATE 100 MG PO TABS: 100.0000 mg | ORAL_TABLET | Freq: Two times a day (BID) | ORAL | 0 refills | Status: AC

## 2023-04-09 MED ORDER — FLUCONAZOLE 150 MG PO TABS
ORAL_TABLET | ORAL | 0 refills | Status: AC
Start: 2023-04-09 — End: ?

## 2023-04-09 NOTE — Patient Instructions (Signed)
 Ok to continue soaks for now.   If you don't hear anything by the end of the week, let me know.   Let us know if you need anything.

## 2023-04-09 NOTE — Progress Notes (Signed)
 Chief Complaint  Patient presents with   Follow-up    Patient presents today for a follow-up from surgery procedure on 03/29/2023. She report itchy, redness and pain.    Nancy May is a 28 y.o. female here for a skin complaint.  Duration: 11 days Location: buttock Pruritic? Yes Painful? Yes Drainage? Yes Trauma? No Other associated symptoms: no fevers, started having redness again She did miss a few days of her antibiotic Therapies tried thus far: Augmentin  Past Medical History:  Diagnosis Date   Allergic rhinitis 05/01/2012   Anxiety    Bacterial vaginosis    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 02/07/20 Retreated 3/24 based on similar presenting symptoms, however benign exam   Lower urinary tract infectious disease 05/01/2012   Nexplanon in place 04/21/2020   Formatting of this note might be different from the original. Nexplanon placed 04/21/2020   Pilonidal sinus 11/28/2018   Possible exposure to STD 09/03/2019   Recurrent vaginitis 04/15/2020   Formatting of this note might be different from the original. Multiple bouts of bacterial vaginosis and feels she cannot clear infection. Wet prep with clue cells, negative whiff 04/08/20 Treat empirically for BV and metrogel for suppression to follow (she was prescribed in the past but did not use for more than 2 months, will plan for 4 - 6 months maintenance of remission after initial treatment).    BP 124/86   Pulse 90   Temp 98.4 F (36.9 C)   Ht 5\' 7"  (1.702 m)   Wt 201 lb 3.2 oz (91.3 kg)   LMP 02/28/2023 (Approximate) Comment: UPT neg on 03/29/23  SpO2 98%   BMI 31.51 kg/m  Gen: awake, alert, appearing stated age Lungs: No accessory muscle use Skin: see below. +warmth and ttp. No fluctuance, excoriation Psych: Age appropriate judgment and insight    Skin infection - Plan: doxycycline (VIBRA-TABS) 100 MG tablet  7 d of doxy. OK to cont sitz baths for now.  Keep area clean and dry. Will reach out to Dr. Azucena Cecil, the patient's general surgeon, for her opinion of the situation. F/u prn. The patient voiced understanding and agreement to the plan.  Jilda Roche [place], DO 04/09/23 4:57 PM

## 2023-04-25 ENCOUNTER — Ambulatory Visit (HOSPITAL_COMMUNITY): Admitting: Licensed Clinical Social Worker

## 2023-04-25 ENCOUNTER — Encounter (HOSPITAL_COMMUNITY): Payer: Self-pay

## 2023-04-27 DIAGNOSIS — Z4889 Encounter for other specified surgical aftercare: Secondary | ICD-10-CM | POA: Diagnosis not present

## 2023-04-28 DIAGNOSIS — Z419 Encounter for procedure for purposes other than remedying health state, unspecified: Secondary | ICD-10-CM | POA: Diagnosis not present

## 2023-05-09 ENCOUNTER — Ambulatory Visit (HOSPITAL_COMMUNITY): Admitting: Licensed Clinical Social Worker

## 2023-05-09 DIAGNOSIS — F331 Major depressive disorder, recurrent, moderate: Secondary | ICD-10-CM

## 2023-05-09 DIAGNOSIS — F411 Generalized anxiety disorder: Secondary | ICD-10-CM

## 2023-05-10 ENCOUNTER — Encounter (HOSPITAL_COMMUNITY): Payer: Self-pay

## 2023-05-10 ENCOUNTER — Encounter (HOSPITAL_COMMUNITY): Payer: Self-pay | Admitting: Licensed Clinical Social Worker

## 2023-05-10 NOTE — Progress Notes (Signed)
 Virtual Visit via Video Note  I connected with Nancy May on 05/09/23 at  4:30 PM EDT by a video enabled telemedicine application and verified that I am speaking with the correct person using two identifiers.  Location: Patient: parked car  Provider: home office   I discussed the limitations of evaluation and management by telemedicine and the availability of in person appointments. The patient expressed understanding and agreed to proceed.      I discussed the assessment and treatment plan with the patient. The patient was provided an opportunity to ask questions and all were answered. The patient agreed with the plan and demonstrated an understanding of the instructions.   The patient was advised to call back or seek an in-person evaluation if the symptoms worsen or if the condition fails to improve as anticipated.  I provided 45 minutes of non-face-to-face time during this encounter.   Seldon Dago, LCSW   THERAPIST PROGRESS NOTE  Session Time: 4:45pm-5:30pm  Participation Level: Active  Behavioral Response: Well GroomedAlertEuthymic  Type of Therapy: Individual Therapy  Treatment Goals addressed:  Problem: Anxiety     Dates: Start:  05/10/23       Disciplines: Interdisciplinary, PROVIDER        Goal: LTG: Nancy May will score less than 5 on the Generalized Anxiety Disorder 7 Scale (GAD-7)      Dates: Start:  05/10/23    Expected End:  11/09/23       Disciplines: Interdisciplinary, PROVIDER             Intervention: Work with Nancy May to track symptoms, triggers, and/or skill use through a mood chart, diary card, or journal     Dates: Start:  05/10/23                Intervention: Perform psychoeducation regarding anxiety disorders     Dates: Start:  05/10/23                Intervention: Work with patient individually to identify the major components of a recent episode of anxiety: physical symptoms, major thoughts and images, and major behaviors  they experienced     Dates: Start:  05/10/23         ProgressTowards Goals: Initial  Interventions: CBT  Summary: Nancy May is a 28 y.o. female who presents with GAD, MDD, recurrent, moderate.   Suicidal/Homicidal: Nowithout intent/plan  Therapist Response: Nancy May engaged well in individual virtual session with Facilities manager. Clinician utilized CBT to process thoughts, feelings, and interactions. Clinician identified some improvement in mood and interactions since last session following getting her dog into her home and changing jobs. Clinician processed the changes and noted improvement in attitude overall, but also identified some challenges in coping with anger that comes from anxiety. Clinician utilized CBT to explore triggers. Clinician processed some deep breathing techniques and also identified the usefulness of changing temperatures to calm her body down, such as cranking the AC in the car, drinking cold water , or splashing cold water  on her face.   Plan: Return again in 2-3 weeks.  Diagnosis: GAD (generalized anxiety disorder)  MDD (major depressive disorder), recurrent episode, moderate (HCC)  Collaboration of Care: Patient refused AEB none required at this time.  Patient/Guardian was advised Release of Information must be obtained prior to any record release in order to collaborate their care with an outside provider. Patient/Guardian was advised if they have not already done so to contact the registration department to sign all necessary forms in order  for us  to release information regarding their care.   Consent: Patient/Guardian gives verbal consent for treatment and assignment of benefits for services provided during this visit. Patient/Guardian expressed understanding and agreed to proceed.   Nancy Stare [place], LCSW 05/10/2023

## 2023-05-14 ENCOUNTER — Encounter: Payer: Self-pay | Admitting: Obstetrics and Gynecology

## 2023-05-14 ENCOUNTER — Ambulatory Visit (INDEPENDENT_AMBULATORY_CARE_PROVIDER_SITE_OTHER): Admitting: Obstetrics and Gynecology

## 2023-05-14 ENCOUNTER — Other Ambulatory Visit (HOSPITAL_COMMUNITY)
Admission: RE | Admit: 2023-05-14 | Discharge: 2023-05-14 | Disposition: A | Discharge status: Home / Self Care | Source: Ambulatory Visit | Attending: Obstetrics and Gynecology | Admitting: Obstetrics and Gynecology

## 2023-05-14 VITALS — BP 112/73 | HR 63 | Wt 204.0 lb

## 2023-05-14 DIAGNOSIS — Z124 Encounter for screening for malignant neoplasm of cervix: Secondary | ICD-10-CM | POA: Insufficient documentation

## 2023-05-14 DIAGNOSIS — Z3046 Encounter for surveillance of implantable subdermal contraceptive: Secondary | ICD-10-CM

## 2023-05-14 NOTE — Progress Notes (Signed)
   ESTABLISHED GYNECOLOGY VISIT No chief complaint on file.   Subjective:  Nancy May is a 28 y.o. G1P0010 presenting for nexplanon removal  Of note, patient noted to have no pap on file. She reports she is due for pap and will do one today.   Review of Systems:   Pertinent items are noted in HPI  Pertinent History Reviewed:  Reviewed past medical,surgical, social and family history.  Reviewed problem list, medications and allergies.  Objective:   Vitals:   05/14/23 1115  BP: 112/73  Pulse: 63  Weight: 204 lb (92.5 kg)   Physical Examination:   General appearance - well appearing, and in no distress  Mental status - alert, oriented to person, place, and time  Psych:  normal mood and affect  Skin - warm and dry, normal color, no suspicious lesions noted  Abdomen - soft, nontender, nondistended, no masses or organomegaly  Pelvic -  VULVA: normal appearing vulva with no masses, tenderness or lesions   VAGINA: normal appearing vagina with normal color and discharge, no lesions   CERVIX: normal appearing cervix without discharge or lesions, no CMT   Thin prep pap is done with reflex HR HPV cotesting  Extremities:  No swelling or varicosities noted  Chaperone present for exam    Nexplanon removal Procedure Patient identified, informed consent performed, consent signed.   Appropriate time out taken. Nexplanon site identified in patient's left arm.  Area prepped in usual sterile fashon. Two ml of 1% lidocaine  was used to anesthetize the area at the distal end of the implant. A small stab incision was made right beside the implant on the distal portion.  The Nexplanon rod was grasped using hemostats and removed without difficulty. There was minimal blood loss. There were no complications. Steri-strips were applied over the small incision. A pressure bandage was applied to reduce any bruising. The patient tolerated the procedure well and was given post procedure  instructions. Patient declines other forms of birth control.  Assessment and Plan:  1. Cervical cancer screening (Primary) Pap done  2. Nexplanon removal Uncomplicated removal, declines contraception    No follow-ups on file.  Future Appointments  Date Time Provider Department Center  06/06/2023  3:30 PM Schlosberg, Merleen Stare, LCSW [id number] None    Marci Setter, MD, FACOG Obstetrician & Gynecologist, [facility] for [facility], [facility]

## 2023-05-15 ENCOUNTER — Ambulatory Visit (HOSPITAL_COMMUNITY): Admitting: Licensed Clinical Social Worker

## 2023-05-16 LAB — CYTOLOGY - PAP: Diagnosis: NEGATIVE

## 2023-05-17 ENCOUNTER — Encounter: Payer: Self-pay | Admitting: Obstetrics and Gynecology

## 2023-05-22 DIAGNOSIS — G4489 Other headache syndrome: Secondary | ICD-10-CM | POA: Diagnosis not present

## 2023-05-28 DIAGNOSIS — Z419 Encounter for procedure for purposes other than remedying health state, unspecified: Secondary | ICD-10-CM | POA: Diagnosis not present

## 2023-06-06 ENCOUNTER — Ambulatory Visit (HOSPITAL_COMMUNITY): Admitting: Licensed Clinical Social Worker

## 2023-06-06 ENCOUNTER — Encounter (HOSPITAL_COMMUNITY): Payer: Self-pay

## 2023-06-11 ENCOUNTER — Ambulatory Visit: Payer: Self-pay | Admitting: Obstetrics and Gynecology

## 2023-06-12 ENCOUNTER — Other Ambulatory Visit: Payer: Self-pay | Admitting: Obstetrics and Gynecology

## 2023-06-12 ENCOUNTER — Ambulatory Visit: Admitting: Obstetrics and Gynecology

## 2023-06-12 VITALS — BP 110/62 | HR 68 | Wt 206.0 lb

## 2023-06-12 DIAGNOSIS — Z975 Presence of (intrauterine) contraceptive device: Secondary | ICD-10-CM | POA: Insufficient documentation

## 2023-06-12 DIAGNOSIS — N760 Acute vaginitis: Secondary | ICD-10-CM

## 2023-06-12 DIAGNOSIS — Z3202 Encounter for pregnancy test, result negative: Secondary | ICD-10-CM

## 2023-06-12 DIAGNOSIS — Z30017 Encounter for initial prescription of implantable subdermal contraceptive: Secondary | ICD-10-CM | POA: Diagnosis not present

## 2023-06-12 LAB — POCT URINE PREGNANCY: Preg Test, Ur: NEGATIVE

## 2023-06-12 MED ORDER — ETONOGESTREL 68 MG ~~LOC~~ IMPL
68.0000 mg | DRUG_IMPLANT | Freq: Once | SUBCUTANEOUS | Status: AC
  Administered 2023-06-12: 68 mg via SUBCUTANEOUS

## 2023-06-12 MED ORDER — METRONIDAZOLE 0.75 % VA GEL: 1.0000 | Freq: Every day | VAGINAL | 1 refills | Status: AC

## 2023-06-12 NOTE — Progress Notes (Signed)
     GYNECOLOGY OFFICE PROCEDURE NOTE  Nancy May is a 28 y.o. G1P0010 here for Nexplanon insertion.  Last pap smear was on 05/14/2023 and was normal.  No other gynecologic concerns.  Nexplanon Insertion Procedure Patient identified, informed consent performed, consent signed.   Patient does understand that irregular bleeding is a very common side effect of this medication. She was advised to have backup contraception for one week after placement. Pregnancy test in clinic today was negative.  Appropriate time out taken.    Patient's left arm was prepped and draped in the usual sterile fashion. The ruler used to measure and mark insertion area.  Patient was prepped with alcohol swab and then injected with 3 ml of 1% lidocaine .  She was prepped with betadine, Nexplanon removed from packaging,  Device confirmed in needle, then inserted full length of needle and withdrawn per handbook instructions. Nexplanon was able to palpated in the patient's arm; patient palpated the insert herself. There was minimal blood loss.  Patient insertion site covered with guaze and a pressure bandage to reduce any bruising.    The patient tolerated the procedure well and was given post procedure instructions.    Susi Eric, FNP  [facility] [employer], [facility]

## 2023-06-12 NOTE — Addendum Note (Signed)
 Addended by: Dot Gazella on: 06/12/2023 01:29 PM   Modules accepted: Orders

## 2023-06-28 DIAGNOSIS — Z419 Encounter for procedure for purposes other than remedying health state, unspecified: Secondary | ICD-10-CM | POA: Diagnosis not present

## 2023-07-28 DIAGNOSIS — Z419 Encounter for procedure for purposes other than remedying health state, unspecified: Secondary | ICD-10-CM | POA: Diagnosis not present

## 2023-08-28 DIAGNOSIS — Z419 Encounter for procedure for purposes other than remedying health state, unspecified: Secondary | ICD-10-CM | POA: Diagnosis not present

## 2023-09-13 ENCOUNTER — Ambulatory Visit: Payer: Self-pay

## 2023-09-13 DIAGNOSIS — M79671 Pain in right foot: Secondary | ICD-10-CM | POA: Diagnosis not present

## 2023-09-13 NOTE — Telephone Encounter (Signed)
 FYI Only or Action Required?: Action required by provider: request for appointment.  Patient was last seen in primary care on 04/09/2023 by Frann Mabel Mt, DO.  Called Nurse Triage reporting Foot Pain.  Symptoms began several years ago.  Interventions attempted: OTC medications: ibuprofen .  Symptoms are: gradually worsening.  Triage Disposition: See PCP Within 2 Weeks  Patient/caregiver understands and will follow disposition?: Unsure  Copied from CRM [id number]. Topic: Clinical - Red Word Triage >> Sep 13, 2023  3:27 PM Suzen RAMAN wrote: Red Word that prompted transfer to Nurse Triage: extreme right foot pain with tingling. Seeking an appt Reason for Disposition  Foot pain is a chronic symptom (recurrent or ongoing AND present > 4 weeks)  Answer Assessment - Initial Assessment Questions 1. ONSET: When did the pain start?      Ongoing for about 3 years, hx of sprains, states that progressively getting worse 2. LOCATION: Where is the pain located?      R foot 3. PAIN: How bad is the pain?    (Scale 1-10; or mild, moderate, severe)     10 4. WORK OR EXERCISE: Has there been any recent work or exercise that involved this part of the body?      denies 5. CAUSE: What do you think is causing the foot pain?     unsure 6. OTHER SYMPTOMS: Do you have any other symptoms? (e.g., leg pain, rash, fever, numbness)     Numbness/tingling-started the past few months 7. PREGNANCY: Is there any chance you are pregnant? When was your last menstrual period?     Denies  Pt was seen today at [facility] and was told to f/u with PCP. Pt was schedule with next available PCP appt, pt would like something sooner as she states I have to go to work and I can't stand on it Pt was given rx for ibuprofen  at [facility], pt states that ibuprofen  does not help with the pain.  Protocols used: Foot Pain-A-AH

## 2023-09-19 ENCOUNTER — Ambulatory Visit: Admitting: Family Medicine

## 2023-09-28 DIAGNOSIS — Z419 Encounter for procedure for purposes other than remedying health state, unspecified: Secondary | ICD-10-CM | POA: Diagnosis not present

## 2023-11-19 ENCOUNTER — Ambulatory Visit: Payer: Self-pay

## 2023-11-19 NOTE — Telephone Encounter (Signed)
 FYI Only or Action Required?: FYI only for provider: appointment scheduled on 11/4.  Patient was last seen in primary care on 04/09/2023 by Frann Mabel Mt, DO.  Called Nurse Triage reporting Foot Pain.  Symptoms began several months ago.  Interventions attempted: OTC medications: Ibuprofen .  Symptoms are: unchanged.  Triage Disposition: See Physician Within 24 Hours  Patient/caregiver understands and will follow disposition?: Yes          Copied from CRM #[id number]. Topic: Clinical - Red Word Triage >> Nov 19, 2023 11:53 AM Thersia BROCKS wrote: [facility] that prompted transfer to Nurse Triage: Patient called in stated foot really painful to walk sharp pain Reason for Disposition  Numbness (i.e., loss of sensation) in foot or toes  (Exception: Just tingling; numbness present > 2 weeks.)  Answer Assessment - Initial Assessment Questions 1. ONSET: When did the pain start?      August 2025, was seen in [facility] for the symptoms x 1 month ago   2. LOCATION: Where is the pain located?      Right foot   3. PAIN: How bad is the pain?    (Scale 1-10; or mild, moderate, severe)     No pain, pain worsens with standing log times, and during the day at work  4. WORK OR EXERCISE: Has there been any recent work or exercise that involved this part of the body?      No   5. CAUSE: What do you think is causing the foot pain?     Unsure   6. OTHER SYMPTOMS: Do you have any other symptoms? (e.g., leg pain, rash, fever, numbness)     Numbness intermittently   7. PREGNANCY: Is there any chance you are pregnant? When was your last menstrual period?             No .  For home care patient is taking Ibuprofen .  Protocols used: Foot Pain-A-AH

## 2023-11-19 NOTE — Telephone Encounter (Signed)
 FYI - appt scheduled   Patient called in stated foot really painful to walk sharp pain

## 2023-11-21 ENCOUNTER — Ambulatory Visit (INDEPENDENT_AMBULATORY_CARE_PROVIDER_SITE_OTHER): Admitting: Family Medicine

## 2023-11-21 ENCOUNTER — Ambulatory Visit (HOSPITAL_BASED_OUTPATIENT_CLINIC_OR_DEPARTMENT_OTHER)
Admission: RE | Admit: 2023-11-21 | Discharge: 2023-11-21 | Disposition: A | Discharge status: Home / Self Care | Source: Ambulatory Visit | Attending: Family Medicine | Admitting: Family Medicine

## 2023-11-21 ENCOUNTER — Ambulatory Visit: Admitting: Family Medicine

## 2023-11-21 ENCOUNTER — Encounter: Payer: Self-pay | Admitting: Family Medicine

## 2023-11-21 VITALS — BP 118/70 | HR 80 | Ht 67.0 in | Wt 207.8 lb

## 2023-11-21 DIAGNOSIS — M25841 Other specified joint disorders, right hand: Secondary | ICD-10-CM

## 2023-11-21 DIAGNOSIS — M79671 Pain in right foot: Secondary | ICD-10-CM | POA: Insufficient documentation

## 2023-11-21 NOTE — Progress Notes (Signed)
 [facility] [address], [address] [place], [place] [address] [phone]  Date:  11/21/2023   Name:  Nancy May   DOB:  May 23, 1995   MRN:  [id number]  PCP:  Almarie Waddell NOVAK, NP    Chief Complaint: Foot Injury (I've had ankle pain for years just from falling, in August in when I started to experience worse pain. The pain is now all over my foot instead of just my ankleit seems to be worse when I'm wearing tennis shoes and its a throbbing sharp pain )   History of Present Illness:  Nancy May is a 28 y.o. very pleasant female patient who presents with the following:  Pt seen today with concern of ankle injury Primary pt of Waddell Almarie- I have not seen her myself in the past   Discussed the use of AI scribe software for clinical note transcription with the patient, who gave verbal consent to proceed.  History of Present Illness Nancy May is a 28 year old female who presents with chronic right foot pain.  She has been experiencing chronic right foot pain since an injury approximately three years ago when she twisted and fell down the steps. The pain has been persistent and worsening since August, characterized by throbbing and sharp sensations, as well as numbness. The pain is primarily located around the foot and the top of the big toe, but not at the original site of injury. 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 She is unable to wear shoes comfortably and cannot sleep on her left side due to pressure on the foot.  She has not had any x-rays of the foot itself, although an ankle x-ray taken a couple of years ago was normal. She has not sustained any new injuries since the original fall. She has been using a profile for pain management, but the relief is temporary. Physical therapy was suggested, but her schedule with school and work has been  a barrier to participation.  She is currently studying dental hygiene and works in [employer]. She does not use arch supports. She has not had any specific therapy for the foot pain yet.  Additionally, she reports a recent onset of throbbing pain in her right index finger, which started two days ago, with no known injury. She has a history of a ganglion cyst on the other hand, which was previously drained. The cyst on her right third finger is bothersome, especially since she is right-handed.    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 02/07/20 Retreated 3/24 based on similar presenting symptoms, however benign exam   Lower urinary tract infectious disease 05/01/2012   Nexplanon  in place 04/21/2020  Formatting of this note might be different from the original. 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 Multiple bouts of bacterial vaginosis and feels she cannot clear infection. Wet prep with clue cells, negative whiff 04/08/20 Treat empirically for BV and metrogel  for suppression to follow (she was prescribed in the past but did not use for more than 2 months, will plan for 4 - 6 months maintenance of remission after initial treatment).     Past Surgical History:  Procedure Laterality Date   PILONIDAL CYST DRAINAGE N/A 03/29/2023   Procedure: EXCISION, PILONIDAL CYST;  Surgeon: Ann Fine, MD;  Location: [facility] OR;  Service: General;  Laterality: N/A;  trephination of pilonidal cyst   WISDOM TOOTH EXTRACTION      Social History   Tobacco Use   Smoking status: Never   Smokeless tobacco: Never  Vaping Use   Vaping status: Never Used  Substance Use Topics   Alcohol use: Not Currently   Drug use: Yes    Frequency: 7.0 times per week    Types: Marijuana    Family History  Problem Relation Age of Onset   Drug abuse Mother    Early death Mother    Heart failure Mother    Drug abuse Father    Heart disease Father    Stroke Maternal Grandmother    Hypertension Maternal Grandmother    Hyperlipidemia Maternal Grandmother    Arthritis Maternal Grandmother     No Known Allergies  Medication list has been reviewed and updated.  Current Outpatient Medications on File Prior to Visit  Medication Sig Dispense Refill   fluconazole  (DIFLUCAN ) 150 MG tablet Take 1 tab, repeat in 72 hours if no improvement. (Patient not taking: Reported on 11/21/2023) 2 tablet 0   ibuprofen  (ADVIL ) 600 MG tablet Take 1 tablet (600 mg total) by mouth every 6 (six) hours as needed. (Patient not taking: Reported on 04/09/2023) 30 tablet 0   metroNIDAZOLE  (METROGEL ) 0.75 % vaginal gel Place 1 Applicatorful vaginally at bedtime. Apply one applicatorful to vagina at bedtime for 5 days (Patient not taking: Reported on 11/21/2023) 70 g 1   oxyCODONE  (ROXICODONE ) 5 MG immediate release tablet Take 0.5-1 tablets (2.5-5 mg total) by mouth every 4 (four) hours as needed for severe pain (pain score 7-10). (Patient not taking: Reported on 11/21/2023) 15 tablet 0   No current facility-administered medications on file prior to visit.    Review of Systems:  As per HPI- otherwise negative.   Physical Examination: Vitals:   11/21/23 0910  BP: 118/70   Pulse: 80  SpO2: 98%   Vitals:   11/21/23 0910  Weight: 207 lb 12.8 oz (94.3 kg)  Height: 5' 7 (1.702 m)   Body mass index is 32.55 kg/m. Ideal Body Weight: Weight in (lb) to have BMI = 25: 159.3  GEN: no acute distress.  Mildly obese, looks well HEENT: Atraumatic, Normocephalic.  Ears and Nose: No external deformity. EXTR: No c/c/e PSYCH: Normally interactive. Conversant.  Right foot appears normal on exam.  At the current time she is not particularly having symptoms of pain.  She does have some minor discomfort with squeeze test which might indicate a Morton's neuroma.  No tenderness over the plantar fascia, normal movement of all joints in the foot and ankle without tenderness.  Examination of her right long finger displays a  small mobile cyst at the MCP joint on the palmar side.   Assessment and Plan: Right foot pain - Plan: DG Foot Complete Right, Ambulatory referral to Sports Medicine  Cyst of joint of hand, right - Plan: Ambulatory referral to Hand Surgery  Assessment & Plan Chronic right foot pain with worsening symptoms, possibly due to neuroma or flat feet. No recent x-rays performed. - Order x-ray of the right foot. - Refer to sports medicine for further evaluation and management. - Recommend custom insoles for better arch support.  Ganglion cyst of right third finger Ganglion cyst on right third finger causing discomfort. Patient averse to needle procedures. - Refer to hand surgeon for evaluation and potential removal of the ganglion cyst.  I encouraged her to ask for cold spray prior to procedure if she prefers a nonlidocaine pain control option  Signed Harlene Schroeder, MD

## 2023-11-21 NOTE — Patient Instructions (Signed)
 Good to see you today- stop by sports med down the hall and see if they can get you in today.  If they cannot see you today I went ahead and ordered x-rays of your foot Also referral in to hand surgery to possibly remove the cyst on your right hand

## 2023-11-22 ENCOUNTER — Other Ambulatory Visit: Payer: Self-pay

## 2023-11-22 ENCOUNTER — Ambulatory Visit

## 2023-11-22 VITALS — BP 120/80 | Ht 67.0 in | Wt 207.0 lb

## 2023-11-22 DIAGNOSIS — M79671 Pain in right foot: Secondary | ICD-10-CM

## 2023-11-22 NOTE — Patient Instructions (Signed)
  VISIT SUMMARY: Today, we discussed your right foot pain and numbness. We identified a plantar plate tear, capsulitis, and mild hallux valgus as the main issues causing your pain. We also considered possible nerve impingement causing numbness and tingling.  YOUR PLAN: RIGHT FOOT PLANTAR PLATE TEAR (TURF TOE) WITH CAPSULITIS AND MILD HALLUX VALGUS: You have a tear in the plantar plate of your big toe, along with inflammation and a slight misalignment of the toe joint. -Use a stiff toe plate to limit the movement of your big toe. -Wear shoes that reduce pressure on your toe joint. -Consider using carbon fiber insoles for additional support. -Apply Voltaren gel to the affected area up to four times daily to reduce inflammation. -Schedule a follow-up visit to reassess your condition and treatment response.  SUSPECTED RIGHT FOOT NERVE IMPINGEMENT: You are experiencing numbness and tingling in your right foot, which may be due to nerve impingement. -Monitor your symptoms and let us  know if the numbness and tingling persist. -We may need to perform another ultrasound if your symptoms do not improve. -We will reassess the nerve involvement if your symptoms do not get better with the initial treatment.

## 2023-11-22 NOTE — Progress Notes (Signed)
 Subjective:    Patient ID: Nancy May, female    DOB: 28 y.o., 10-10-1995   MRN: [id number]  Chief Complaint: Right foot pain  Discussed the use of AI scribe software for clinical note transcription with the patient, who gave verbal consent to proceed.  History of Present Illness Nancy May is a 28 year old female who presents with right foot pain.  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  Plantar to the first MTP joint.  Single plantar second sesamoid of the second MTP.  Similar appearing sesamoid present plantar to the fifth MTP.  Os fibulare versus peroneal tendon calcification present lateral to the cuboid.  No acute osseous abnormalities.     Objective:   Vitals:   11/22/23 1102  BP: 120/80    Right Ankle ( compared to normal ) Inspection: Normal transverse arches without significant collapse.  Pes planus and prominent calcaneal valgus which corrects with plantarflexion.  Hallux valgus. Palpation: TTP +ATFL, - CFL, - PTFL, - anterior joint line, - navicular, - base of 5th metatarsal, - deltoid, -  tarsal, -  metatarsal, - achilles, - plantar fascia, + first MTP (medial> dorsal> plantar).  AROM/PROM: Full plantarflexion, dorsiflexion, eversion, inversion.  Significant pain with passive dorsiflexion of the great toe. Strength: 5/5 plantarflexion, 5/5 dorsiflexion, 5/5 eversion, 5/5 inversion Special tests: - talar tilt test, - anterior drawer, - proximal squeeze, - calcaneal squeeze.  Negative Mulder sign.  Negative forefoot squeeze.  Unable to tolerate hop test.  Limited ultrasound examination of the left foot: The extensor hallucis tendon is well-visualized and appears to have a superficial dorsal nerve of the foot traversed directly under it at the point of the first MTP joint and questionably appears slightly enlarged in this region compared to both proximal and distal to this. The first MTP joint is well-visualized and appears to carry a modest hypoechogenic fluid signal intra-articularly with some element of synovial hypertrophy present particularly along the medial aspect. The plantar plate is well-visualized and with dynamic passive dorsiflexion, a hypoechogenic linear disruption of the normal plate architecture appears. 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 Ultrasound shows a plantar plate tear, capsulitis, and mild hallux valgus. Pain increases with standing, walking, and toe pressure. X-ray shows no acute osteoabnormalities. 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 Recommend a stiff toe plate to limit big toe flexion and advise footwear that reduces joint pressure. Suggest carbon fiber insoles for support. Prescribe Voltaren gel  for topical use up to four times daily to decrease inflammation. Plan a follow-up visit to reassess her condition and treatment response.  Suspected superficial dorsal nerve of the foot impingement She experiences numbness and tingling in the right foot, possibly due to nerve impingement. Ultrasound indicates a nerve under the extensor tendon, potentially causing symptoms. Consider nerve compression at the master knot of Victory or other structures. Monitor symptoms and consider further ultrasound if numbness and tingling persist after addressing the plantar plate tear. Reassess nerve involvement if symptoms do not improve with initial treatment.  Greater than 60 minutes was spent in face-to-face conversation with the patient regarding the longstanding nature of her condition, conducting a physical exam, reviewing captured images from today's encounter, reviewing pertinent prior imaging obtained the day before, and discussing treatment options.

## 2023-11-27 ENCOUNTER — Encounter: Payer: Self-pay | Admitting: Family Medicine

## 2023-11-28 DIAGNOSIS — Z419 Encounter for procedure for purposes other than remedying health state, unspecified: Secondary | ICD-10-CM | POA: Diagnosis not present

## 2023-11-29 ENCOUNTER — Ambulatory Visit (INDEPENDENT_AMBULATORY_CARE_PROVIDER_SITE_OTHER)

## 2023-11-29 VITALS — Ht 67.0 in | Wt 207.0 lb

## 2023-11-29 DIAGNOSIS — M79671 Pain in right foot: Secondary | ICD-10-CM | POA: Diagnosis not present

## 2023-11-29 DIAGNOSIS — G5781 Other specified mononeuropathies of right lower limb: Secondary | ICD-10-CM

## 2023-11-29 DIAGNOSIS — M7751 Other enthesopathy of right foot: Secondary | ICD-10-CM | POA: Diagnosis present

## 2023-11-29 NOTE — Progress Notes (Signed)
   Subjective:    Patient ID: Nancy May, female    DOB: 28 y.o., 1995/05/18   MRN: [id number]  Chief Complaint: MTP joint sprain, dorsal cutaneous nerve entrapment follow-up (1 week)    History of Present Illness   Patient reports she has been using Voltaren gel approximately once every other day and only 1 time per day Also taking some oral ibuprofen  and that seems to help a little bit as well Still getting some symptoms while at work. Was not able to pick up carbon fiber insoles yet    Objective:   There were no vitals filed for this visit.  Right foot Great toe with mild pain on passive dorsiflexion. No tenderness at medial calcaneal tuberosity.      Assessment & Plan:   Assessment & Plan Nancy May actually seems quite a bit improved from when I saw her about a week ago with only 1 time per day with every other day dosing of Voltaren gel over the area.  Recommended she increase this to recommended she increase this to 2-4 times per day and obtain carbon fiber insole to minimize motion at the first MTP joint.  Will plan for 2-3-week follow-up.  If pain still worsening/persisting at that time, can consider injection or oral anti-inflammatory.

## 2023-12-09 NOTE — Progress Notes (Unsigned)
 Nancy May - 28 y.o. female MRN [id number]  Date of birth: 07/17/1995  Office Visit Note: Visit Date: 12/10/2023 PCP: Almarie Waddell NOVAK, NP Referred by: Watt Harlene BROCKS, MD  Subjective: No chief complaint on file.  HPI: Nancy May is a pleasant 28 y.o. female who presents today for evaluation of a right long finger volar mass just distal to the MCP region has been present now for roughly 1 year.  She feels that it has enlarged in size and is becoming more painful, particular with heavy grip activities.  Feels it is mobile to her with motion of the long finger.  Has not undergone prior treatments.  Pertinent ROS were reviewed with the patient and found to be negative unless otherwise specified above in HPI.   Visit Reason: right long finger/hand Duration of symptoms: 1 year Hand dominance: right Occupation: server Diabetic: No Smoking: No Heart/Lung History: none Blood Thinners:  none  Prior Testing/EMG: none Injections (Date): none Treatments: none Prior Surgery: none    Assessment & Plan: Visit Diagnoses:  1. Mass of finger of right hand     Plan: Based on clinical examination today, this appears to be a cyst emanating from the flexor tendon sheath of the long finger.  It is soft, compressible and mobile in nature.  Given that it is giving her ongoing symptoms, we discussed surgical excision.  Risks and benefits of the procedure were discussed, risks including but not limited to infection, bleeding, scarring, stiffness, nerve injury, tendon injury, vascular injury, persistent mass, recurrence of symptoms and need for subsequent operation.  We also discussed the appropriate postoperative protocol and timeframe for return to activities and function.  We discussed that the mass in question will be sent as a permanent surgical specimen for pathology.  Patient expressed understanding.  We can move forward with surgical scheduling of right long finger volar mass  excision at the next available date per patient preference.    Follow-up: No follow-ups on file.   Meds & Orders: No orders of the defined types were placed in this encounter.  No orders of the defined types were placed in this encounter.    Procedures: No procedures performed      Clinical History: No specialty comments available.  She reports that she has never smoked. She has never used smokeless tobacco. No results for input(s): HGBA1C, LABURIC in the last 8760 hours.  Objective:   Vital Signs: There were no vitals taken for this visit.  Physical Exam  Gen: Well-appearing, in no acute distress; non-toxic CV: Regular Rate. Well-perfused. Warm.  Resp: Breathing unlabored on room air; no wheezing. Psych: Fluid speech in conversation; appropriate affect; normal thought process  Ortho Exam Right hand: - Volar mass over the long finger, just distal to the MP crease, soft, compressible, mobile, measures approximately 1.5 cm x 1.5 cm - Long finger remains warm and well-perfused, no triggering, composite fist without restriction, sensation intact distally, normal capillary refill   Imaging: No results found.  Past Medical/Family/Surgical/Social History: Medications & Allergies reviewed per EMR, new medications updated. 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  02/07/20 Retreated 3/24 based on similar presenting symptoms, however benign exam   Lower urinary tract infectious disease 05/01/2012   Nexplanon  in place 04/21/2020   Formatting of this note might be different from the original. 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 Multiple bouts of bacterial vaginosis and feels she cannot clear infection. Wet prep with clue cells, negative whiff 04/08/20 Treat empirically for BV and metrogel  for suppression to follow (she was prescribed in the past but did not use for more than 2 months, will plan for 4 - 6 months maintenance of remission after initial treatment).   Family History  Problem Relation Age of Onset   Drug abuse Mother    Early death Mother    Heart failure Mother    Drug abuse Father    Heart disease Father    Stroke Maternal Grandmother    Hypertension Maternal Grandmother    Hyperlipidemia Maternal Grandmother    Arthritis Maternal Grandmother    Past Surgical History:  Procedure Laterality Date   PILONIDAL CYST DRAINAGE N/A 03/29/2023   Procedure: EXCISION, PILONIDAL CYST;  Surgeon: Ann Fine, MD;  Location: [facility] OR;  Service: General;  Laterality: N/A;  trephination of pilonidal cyst   WISDOM TOOTH EXTRACTION     Social History   Occupational History   Not on file  Tobacco Use   Smoking status: Never   Smokeless tobacco: Never  Vaping Use   Vaping status: Never Used  Substance and Sexual Activity   Alcohol use: Not Currently   Drug use: Yes    Frequency: 7.0 times per week    Types: Marijuana   Sexual activity: Yes    Birth control/protection: None, Implant    Jaquell Seddon Afton Alderton, M.D. [facility], Hand Surgery

## 2023-12-10 ENCOUNTER — Ambulatory Visit (INDEPENDENT_AMBULATORY_CARE_PROVIDER_SITE_OTHER): Admitting: Orthopedic Surgery

## 2023-12-10 DIAGNOSIS — R2231 Localized swelling, mass and lump, right upper limb: Secondary | ICD-10-CM

## 2023-12-11 IMAGING — DX DG CERVICAL SPINE COMPLETE 4+V
6 series · 6 of 6 positions shown · non-contrast
Comparison: None.

CLINICAL DATA: Right medial scapula pain

EXAM:
CERVICAL SPINE - COMPLETE 4+ VIEW

[c-spine lat]
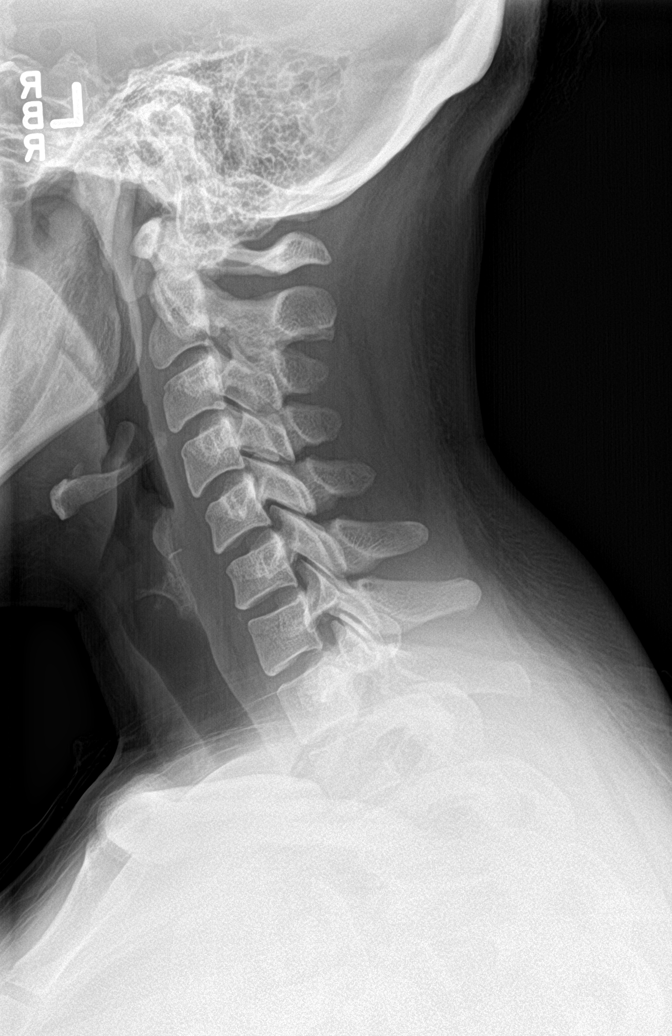

[c-spine obl (1 of 2)]
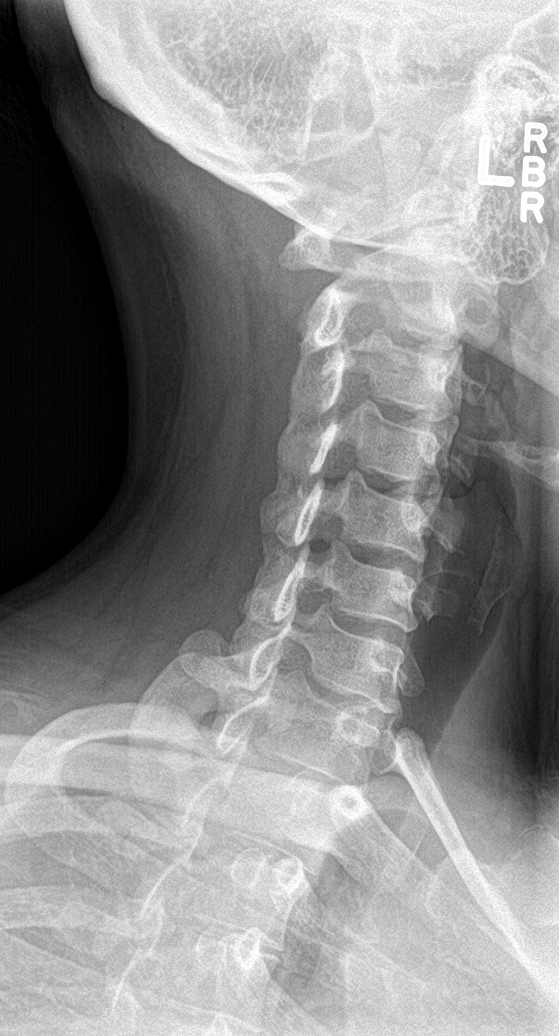

[c-spine obl (2 of 2)]
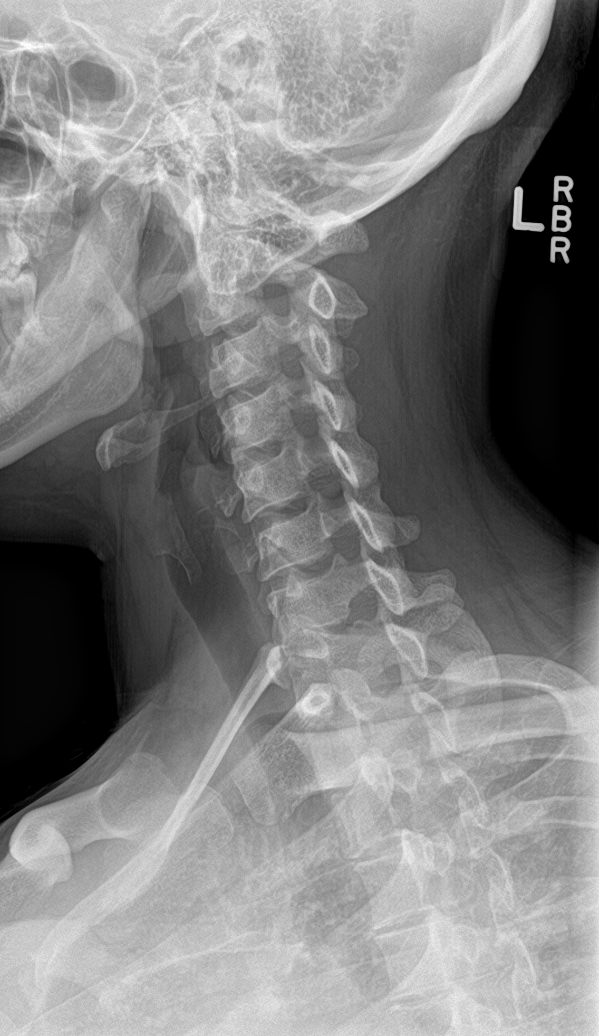

[c-spine ap]
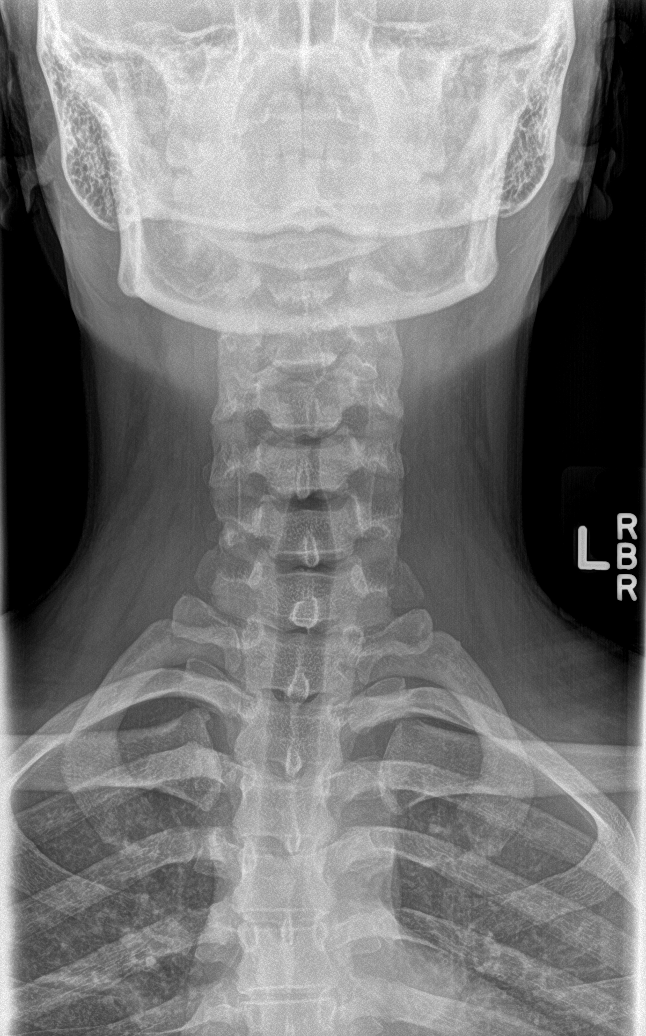

[c-spine open mouth]
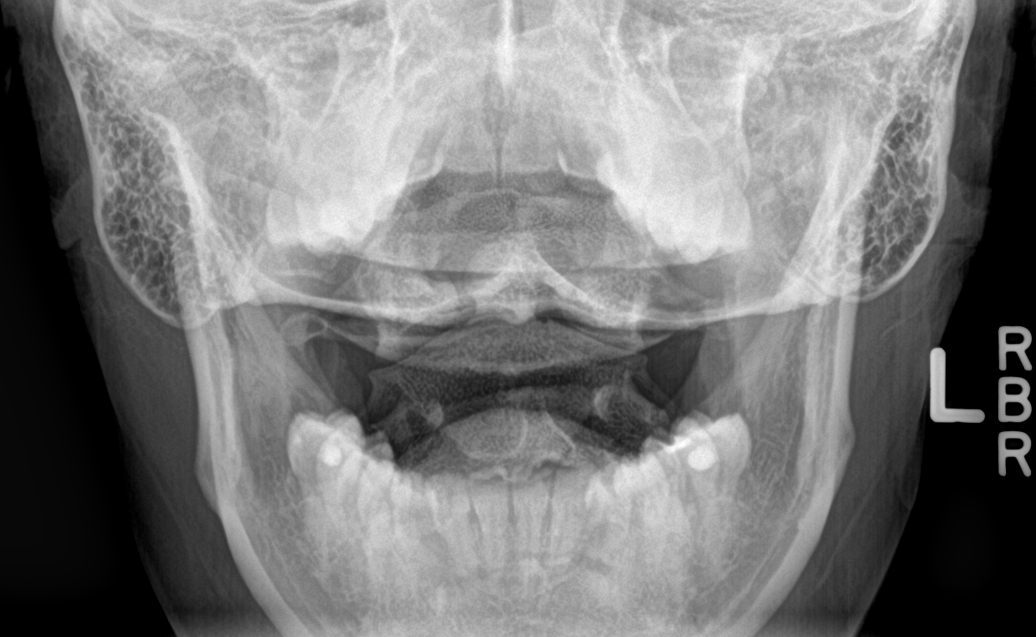

[[person_name]]
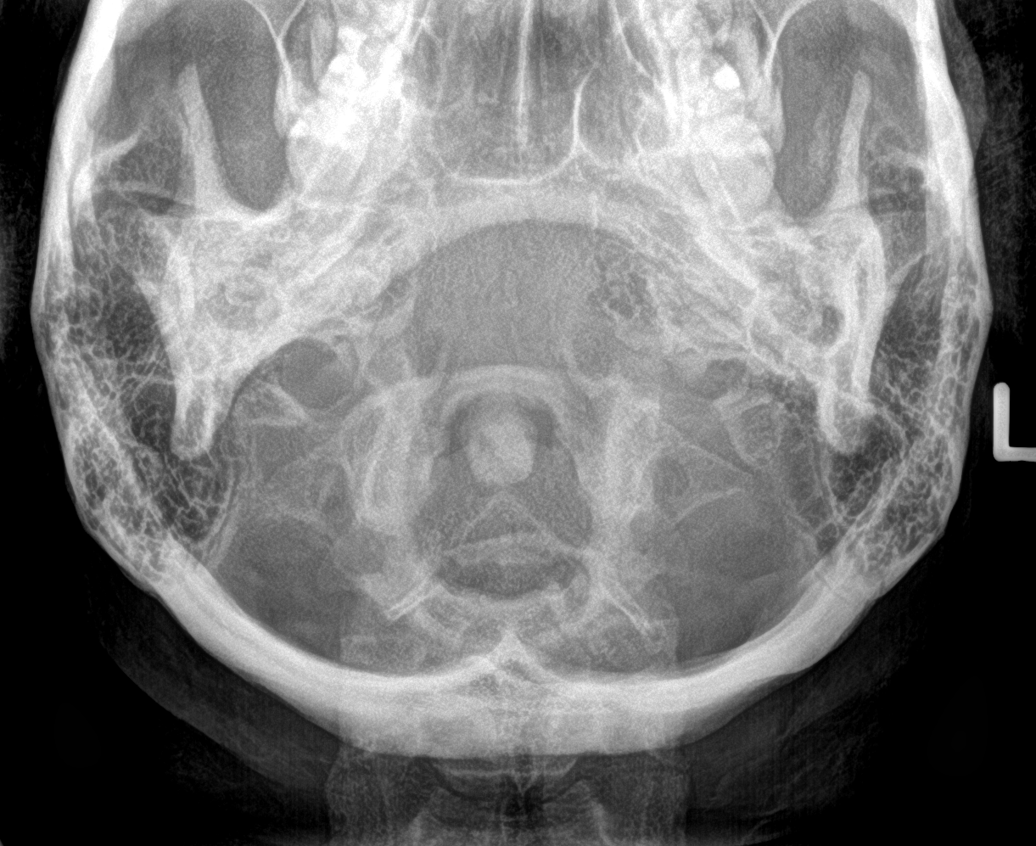

[6 of 6 positions shown; findings below may reference images not displayed]

FINDINGS: There is no evidence of cervical spine fracture or prevertebral soft
tissue swelling. Alignment is normal. No other significant bone
abnormalities are identified.
IMPRESSION: Negative cervical spine radiographs.

## 2023-12-28 DIAGNOSIS — Z419 Encounter for procedure for purposes other than remedying health state, unspecified: Secondary | ICD-10-CM | POA: Diagnosis not present
